# Patient Record
Sex: Female | Born: 1960 | Race: Black or African American | Hispanic: No | Smoking: Never smoker
Health system: Southern US, Community
[De-identification: ages and names within clinical notes are randomized; demographics above are authoritative.]

## PROBLEM LIST (undated history)

## (undated) DIAGNOSIS — C801 Malignant (primary) neoplasm, unspecified: Secondary | ICD-10-CM

## (undated) DIAGNOSIS — I10 Essential (primary) hypertension: Secondary | ICD-10-CM

## (undated) HISTORY — DX: Malignant (primary) neoplasm, unspecified: C80.1

## (undated) HISTORY — PX: COLON SURGERY: SHX602

---

## 2001-04-05 ENCOUNTER — Other Ambulatory Visit: Admission: RE | Admit: 2001-04-05 | Discharge: 2001-04-05 | Payer: Self-pay | Admitting: Family Medicine

## 2002-06-03 ENCOUNTER — Encounter: Admission: RE | Admit: 2002-06-03 | Discharge: 2002-06-03 | Payer: Self-pay | Admitting: Family Medicine

## 2002-06-03 ENCOUNTER — Encounter: Payer: Self-pay | Admitting: Family Medicine

## 2002-07-07 ENCOUNTER — Encounter: Admission: RE | Admit: 2002-07-07 | Discharge: 2002-07-07 | Payer: Self-pay

## 2003-05-23 ENCOUNTER — Other Ambulatory Visit: Admission: RE | Admit: 2003-05-23 | Discharge: 2003-05-23 | Payer: Self-pay | Admitting: Family Medicine

## 2003-06-02 ENCOUNTER — Encounter: Admission: RE | Admit: 2003-06-02 | Discharge: 2003-06-02 | Payer: Self-pay | Admitting: Family Medicine

## 2003-06-14 ENCOUNTER — Encounter: Admission: RE | Admit: 2003-06-14 | Discharge: 2003-06-14 | Payer: Self-pay | Admitting: Family Medicine

## 2003-11-28 ENCOUNTER — Encounter: Admission: RE | Admit: 2003-11-28 | Discharge: 2003-11-28 | Payer: Self-pay | Admitting: Family Medicine

## 2004-01-14 DIAGNOSIS — C189 Malignant neoplasm of colon, unspecified: Secondary | ICD-10-CM

## 2004-01-14 HISTORY — DX: Malignant neoplasm of colon, unspecified: C18.9

## 2004-09-24 ENCOUNTER — Other Ambulatory Visit: Admission: RE | Admit: 2004-09-24 | Discharge: 2004-09-24 | Payer: Self-pay | Admitting: Family Medicine

## 2004-12-20 ENCOUNTER — Ambulatory Visit (HOSPITAL_COMMUNITY): Admission: RE | Admit: 2004-12-20 | Discharge: 2004-12-20 | Payer: Self-pay | Admitting: Gastroenterology

## 2004-12-24 ENCOUNTER — Ambulatory Visit (HOSPITAL_COMMUNITY): Admission: RE | Admit: 2004-12-24 | Discharge: 2004-12-24 | Payer: Self-pay | Admitting: Gastroenterology

## 2005-01-09 ENCOUNTER — Inpatient Hospital Stay (HOSPITAL_COMMUNITY): Admission: RE | Admit: 2005-01-09 | Discharge: 2005-01-15 | Payer: Self-pay | Admitting: General Surgery

## 2005-01-09 ENCOUNTER — Encounter (INDEPENDENT_AMBULATORY_CARE_PROVIDER_SITE_OTHER): Payer: Self-pay | Admitting: Specialist

## 2005-01-28 ENCOUNTER — Ambulatory Visit: Payer: Self-pay | Admitting: Hematology and Oncology

## 2005-02-19 ENCOUNTER — Ambulatory Visit (HOSPITAL_COMMUNITY): Admission: RE | Admit: 2005-02-19 | Discharge: 2005-02-19 | Payer: Self-pay | Admitting: General Surgery

## 2005-03-20 ENCOUNTER — Ambulatory Visit: Payer: Self-pay | Admitting: Hematology and Oncology

## 2005-04-23 LAB — COMPREHENSIVE METABOLIC PANEL
AST: 35 U/L (ref 0–37)
Albumin: 4.6 g/dL (ref 3.5–5.2)
BUN: 10 mg/dL (ref 6–23)
Calcium: 9.3 mg/dL (ref 8.4–10.5)
Chloride: 102 mEq/L (ref 96–112)
Potassium: 3.7 mEq/L (ref 3.5–5.3)

## 2005-04-23 LAB — CBC WITH DIFFERENTIAL/PLATELET
BASO%: 0.4 % (ref 0.0–2.0)
Basophils Absolute: 0 10*3/uL (ref 0.0–0.1)
HCT: 35.1 % (ref 34.8–46.6)
HGB: 11.6 g/dL (ref 11.6–15.9)
MCHC: 32.9 g/dL (ref 32.0–36.0)
MONO#: 0.5 10*3/uL (ref 0.1–0.9)
NEUT#: 1.6 10*3/uL (ref 1.5–6.5)
NEUT%: 35.1 % — ABNORMAL LOW (ref 39.6–76.8)
WBC: 4.7 10*3/uL (ref 3.9–10.0)
lymph#: 2.4 10*3/uL (ref 0.9–3.3)

## 2005-05-06 ENCOUNTER — Ambulatory Visit: Payer: Self-pay | Admitting: Hematology and Oncology

## 2005-05-07 LAB — COMPREHENSIVE METABOLIC PANEL
AST: 53 U/L — ABNORMAL HIGH (ref 0–37)
Albumin: 4.2 g/dL (ref 3.5–5.2)
BUN: 10 mg/dL (ref 6–23)
CO2: 30 mEq/L (ref 19–32)
Calcium: 9.2 mg/dL (ref 8.4–10.5)
Chloride: 101 mEq/L (ref 96–112)
Creatinine, Ser: 1 mg/dL (ref 0.4–1.2)
Glucose, Bld: 91 mg/dL (ref 70–99)
Potassium: 3.7 mEq/L (ref 3.5–5.3)

## 2005-05-07 LAB — CBC WITH DIFFERENTIAL/PLATELET
Basophils Absolute: 0 10*3/uL (ref 0.0–0.1)
EOS%: 0.2 % (ref 0.0–7.0)
Eosinophils Absolute: 0 10*3/uL (ref 0.0–0.5)
HCT: 34.9 % (ref 34.8–46.6)
HGB: 11.7 g/dL (ref 11.6–15.9)
MCH: 29.4 pg (ref 26.0–34.0)
NEUT#: 3.9 10*3/uL (ref 1.5–6.5)
NEUT%: 56.3 % (ref 39.6–76.8)
lymph#: 2.4 10*3/uL (ref 0.9–3.3)

## 2005-05-07 LAB — MAGNESIUM: Magnesium: 1.8 mg/dL (ref 1.5–2.5)

## 2005-05-15 LAB — CBC WITH DIFFERENTIAL/PLATELET
Eosinophils Absolute: 0 10*3/uL (ref 0.0–0.5)
HCT: 36.9 % (ref 34.8–46.6)
HGB: 12.5 g/dL (ref 11.6–15.9)
LYMPH%: 49 % — ABNORMAL HIGH (ref 14.0–48.0)
MONO#: 0.6 10*3/uL (ref 0.1–0.9)
NEUT#: 1.6 10*3/uL (ref 1.5–6.5)
NEUT%: 35.9 % — ABNORMAL LOW (ref 39.6–76.8)
Platelets: 190 10*3/uL (ref 145–400)
WBC: 4.5 10*3/uL (ref 3.9–10.0)

## 2005-06-04 LAB — CBC WITH DIFFERENTIAL/PLATELET
Eosinophils Absolute: 0 10*3/uL (ref 0.0–0.5)
HCT: 36.6 % (ref 34.8–46.6)
LYMPH%: 43.4 % (ref 14.0–48.0)
MCHC: 33.3 g/dL (ref 32.0–36.0)
MCV: 94.3 fL (ref 81.0–101.0)
MONO#: 0.5 10*3/uL (ref 0.1–0.9)
NEUT#: 2.1 10*3/uL (ref 1.5–6.5)
NEUT%: 44.9 % (ref 39.6–76.8)
Platelets: 167 10*3/uL (ref 145–400)
WBC: 4.7 10*3/uL (ref 3.9–10.0)

## 2005-06-04 LAB — COMPREHENSIVE METABOLIC PANEL
CO2: 27 mEq/L (ref 19–32)
Creatinine, Ser: 0.8 mg/dL (ref 0.4–1.2)
Glucose, Bld: 92 mg/dL (ref 70–99)
Total Bilirubin: 0.7 mg/dL (ref 0.3–1.2)

## 2005-06-04 LAB — MAGNESIUM: Magnesium: 1.7 mg/dL (ref 1.5–2.5)

## 2005-06-23 ENCOUNTER — Ambulatory Visit: Payer: Self-pay | Admitting: Hematology and Oncology

## 2005-06-25 LAB — CBC WITH DIFFERENTIAL/PLATELET
Basophils Absolute: 0 10*3/uL (ref 0.0–0.1)
Eosinophils Absolute: 0 10*3/uL (ref 0.0–0.5)
LYMPH%: 51 % — ABNORMAL HIGH (ref 14.0–48.0)
MCV: 97.7 fL (ref 81.0–101.0)
MONO%: 10.4 % (ref 0.0–13.0)
NEUT#: 1.7 10*3/uL (ref 1.5–6.5)
Platelets: 169 10*3/uL (ref 145–400)
RBC: 3.83 10*6/uL (ref 3.70–5.32)

## 2005-06-25 LAB — COMPREHENSIVE METABOLIC PANEL
Alkaline Phosphatase: 67 U/L (ref 39–117)
BUN: 11 mg/dL (ref 6–23)
Creatinine, Ser: 0.79 mg/dL (ref 0.40–1.20)
Glucose, Bld: 92 mg/dL (ref 70–99)
Sodium: 135 mEq/L (ref 135–145)
Total Bilirubin: 0.7 mg/dL (ref 0.3–1.2)

## 2005-07-08 ENCOUNTER — Encounter: Admission: RE | Admit: 2005-07-08 | Discharge: 2005-07-08 | Payer: Self-pay | Admitting: Hematology and Oncology

## 2005-08-04 ENCOUNTER — Ambulatory Visit: Payer: Self-pay | Admitting: Hematology and Oncology

## 2005-08-04 LAB — CBC WITH DIFFERENTIAL/PLATELET
BASO%: 0.2 % (ref 0.0–2.0)
EOS%: 0.6 % (ref 0.0–7.0)
MCH: 33.3 pg (ref 26.0–34.0)
MCHC: 33.4 g/dL (ref 32.0–36.0)
MCV: 99.5 fL (ref 81.0–101.0)
MONO%: 9.3 % (ref 0.0–13.0)
RBC: 4.05 10*6/uL (ref 3.70–5.32)
RDW: 16.3 % — ABNORMAL HIGH (ref 11.3–14.5)

## 2005-08-04 LAB — COMPREHENSIVE METABOLIC PANEL
AST: 32 U/L (ref 0–37)
Albumin: 4.5 g/dL (ref 3.5–5.2)
Alkaline Phosphatase: 65 U/L (ref 39–117)
BUN: 12 mg/dL (ref 6–23)
Potassium: 3.8 mEq/L (ref 3.5–5.3)
Sodium: 142 mEq/L (ref 135–145)
Total Protein: 7.6 g/dL (ref 6.0–8.3)

## 2005-08-05 ENCOUNTER — Ambulatory Visit (HOSPITAL_COMMUNITY): Admission: RE | Admit: 2005-08-05 | Discharge: 2005-08-05 | Payer: Self-pay | Admitting: Hematology and Oncology

## 2005-09-24 ENCOUNTER — Ambulatory Visit: Payer: Self-pay | Admitting: Hematology and Oncology

## 2005-11-04 ENCOUNTER — Ambulatory Visit (HOSPITAL_COMMUNITY): Admission: RE | Admit: 2005-11-04 | Discharge: 2005-11-04 | Payer: Self-pay | Admitting: Hematology and Oncology

## 2005-11-07 ENCOUNTER — Ambulatory Visit: Payer: Self-pay | Admitting: Hematology and Oncology

## 2005-11-21 ENCOUNTER — Ambulatory Visit (HOSPITAL_BASED_OUTPATIENT_CLINIC_OR_DEPARTMENT_OTHER): Admission: RE | Admit: 2005-11-21 | Discharge: 2005-11-21 | Payer: Self-pay | Admitting: General Surgery

## 2006-01-27 ENCOUNTER — Ambulatory Visit: Payer: Self-pay | Admitting: Hematology and Oncology

## 2006-02-04 LAB — COMPREHENSIVE METABOLIC PANEL
AST: 18 U/L (ref 0–37)
Albumin: 4.5 g/dL (ref 3.5–5.2)
Alkaline Phosphatase: 53 U/L (ref 39–117)
BUN: 11 mg/dL (ref 6–23)
Calcium: 9.4 mg/dL (ref 8.4–10.5)
Chloride: 101 mEq/L (ref 96–112)
Glucose, Bld: 118 mg/dL — ABNORMAL HIGH (ref 70–99)
Potassium: 3.6 mEq/L (ref 3.5–5.3)
Sodium: 140 mEq/L (ref 135–145)
Total Protein: 7.2 g/dL (ref 6.0–8.3)

## 2006-02-04 LAB — CBC WITH DIFFERENTIAL/PLATELET
Basophils Absolute: 0 10*3/uL (ref 0.0–0.1)
Eosinophils Absolute: 0.1 10*3/uL (ref 0.0–0.5)
HGB: 12.4 g/dL (ref 11.6–15.9)
MONO#: 0.3 10*3/uL (ref 0.1–0.9)
MONO%: 4.8 % (ref 0.0–13.0)
NEUT#: 3.9 10*3/uL (ref 1.5–6.5)
RBC: 4.18 10*6/uL (ref 3.70–5.32)
RDW: 13 % (ref 11.3–14.5)
WBC: 6.8 10*3/uL (ref 3.9–10.0)
lymph#: 2.4 10*3/uL (ref 0.9–3.3)

## 2006-04-28 ENCOUNTER — Ambulatory Visit: Payer: Self-pay | Admitting: Hematology and Oncology

## 2006-05-01 LAB — CBC WITH DIFFERENTIAL/PLATELET
BASO%: 0.5 % (ref 0.0–2.0)
Basophils Absolute: 0 10*3/uL (ref 0.0–0.1)
EOS%: 2 % (ref 0.0–7.0)
Eosinophils Absolute: 0.1 10*3/uL (ref 0.0–0.5)
HGB: 12.2 g/dL (ref 11.6–15.9)
LYMPH%: 47.3 % (ref 14.0–48.0)
MCH: 29.4 pg (ref 26.0–34.0)
MCHC: 33.8 g/dL (ref 32.0–36.0)
MCV: 87.1 fL (ref 81.0–101.0)
MONO%: 4.7 % (ref 0.0–13.0)
NEUT#: 2.7 10*3/uL (ref 1.5–6.5)
NEUT%: 45.5 % (ref 39.6–76.8)
Platelets: 273 10*3/uL (ref 145–400)
RBC: 4.15 10*6/uL (ref 3.70–5.32)

## 2006-05-01 LAB — COMPREHENSIVE METABOLIC PANEL
AST: 22 U/L (ref 0–37)
Albumin: 4.3 g/dL (ref 3.5–5.2)
Alkaline Phosphatase: 46 U/L (ref 39–117)
BUN: 11 mg/dL (ref 6–23)
Calcium: 9.2 mg/dL (ref 8.4–10.5)
Chloride: 101 mEq/L (ref 96–112)
Creatinine, Ser: 0.79 mg/dL (ref 0.40–1.20)
Glucose, Bld: 102 mg/dL — ABNORMAL HIGH (ref 70–99)
Total Bilirubin: 0.8 mg/dL (ref 0.3–1.2)

## 2006-05-04 ENCOUNTER — Ambulatory Visit (HOSPITAL_COMMUNITY): Admission: RE | Admit: 2006-05-04 | Discharge: 2006-05-04 | Payer: Self-pay | Admitting: Hematology and Oncology

## 2006-09-09 ENCOUNTER — Ambulatory Visit: Payer: Self-pay | Admitting: Hematology and Oncology

## 2006-09-11 LAB — CBC WITH DIFFERENTIAL/PLATELET
Basophils Absolute: 0 10*3/uL (ref 0.0–0.1)
Eosinophils Absolute: 0.1 10*3/uL (ref 0.0–0.5)
HCT: 33.4 % — ABNORMAL LOW (ref 34.8–46.6)
HGB: 11.5 g/dL — ABNORMAL LOW (ref 11.6–15.9)
LYMPH%: 48.6 % — ABNORMAL HIGH (ref 14.0–48.0)
MONO#: 0.3 10*3/uL (ref 0.1–0.9)
NEUT#: 2.4 10*3/uL (ref 1.5–6.5)
Platelets: 293 10*3/uL (ref 145–400)
RBC: 3.88 10*6/uL (ref 3.70–5.32)
WBC: 5.4 10*3/uL (ref 3.9–10.0)

## 2006-09-11 LAB — COMPREHENSIVE METABOLIC PANEL
Albumin: 4.2 g/dL (ref 3.5–5.2)
BUN: 12 mg/dL (ref 6–23)
CO2: 26 mEq/L (ref 19–32)
Glucose, Bld: 85 mg/dL (ref 70–99)
Potassium: 3.8 mEq/L (ref 3.5–5.3)
Sodium: 137 mEq/L (ref 135–145)
Total Bilirubin: 0.4 mg/dL (ref 0.3–1.2)
Total Protein: 6.7 g/dL (ref 6.0–8.3)

## 2006-12-18 ENCOUNTER — Ambulatory Visit: Payer: Self-pay | Admitting: Hematology and Oncology

## 2006-12-22 LAB — CBC WITH DIFFERENTIAL/PLATELET
Basophils Absolute: 0 10*3/uL (ref 0.0–0.1)
Eosinophils Absolute: 0.1 10*3/uL (ref 0.0–0.5)
HCT: 34.7 % — ABNORMAL LOW (ref 34.8–46.6)
HGB: 11.8 g/dL (ref 11.6–15.9)
LYMPH%: 45.3 % (ref 14.0–48.0)
MCH: 29.6 pg (ref 26.0–34.0)
MCV: 87 fL (ref 81.0–101.0)
MONO%: 7.9 % (ref 0.0–13.0)
NEUT#: 2.8 10*3/uL (ref 1.5–6.5)
NEUT%: 45.2 % (ref 39.6–76.8)
Platelets: 331 10*3/uL (ref 145–400)
RDW: 13 % (ref 11.3–14.5)

## 2006-12-22 LAB — COMPREHENSIVE METABOLIC PANEL
Albumin: 4.3 g/dL (ref 3.5–5.2)
Alkaline Phosphatase: 46 U/L (ref 39–117)
BUN: 12 mg/dL (ref 6–23)
Creatinine, Ser: 1.05 mg/dL (ref 0.40–1.20)
Glucose, Bld: 92 mg/dL (ref 70–99)
Potassium: 3.8 mEq/L (ref 3.5–5.3)

## 2006-12-23 ENCOUNTER — Ambulatory Visit (HOSPITAL_COMMUNITY): Admission: RE | Admit: 2006-12-23 | Discharge: 2006-12-23 | Payer: Self-pay | Admitting: Hematology and Oncology

## 2007-03-31 ENCOUNTER — Emergency Department (HOSPITAL_COMMUNITY): Admission: EM | Admit: 2007-03-31 | Discharge: 2007-03-31 | Payer: Self-pay | Admitting: Emergency Medicine

## 2007-06-15 ENCOUNTER — Ambulatory Visit: Payer: Self-pay | Admitting: Hematology and Oncology

## 2007-06-17 LAB — CBC WITH DIFFERENTIAL/PLATELET
BASO%: 0.5 % (ref 0.0–2.0)
Basophils Absolute: 0 10*3/uL (ref 0.0–0.1)
EOS%: 1.1 % (ref 0.0–7.0)
HGB: 13.1 g/dL (ref 11.6–15.9)
MCH: 29 pg (ref 26.0–34.0)
MCHC: 33.5 g/dL (ref 32.0–36.0)
MONO#: 0.4 10*3/uL (ref 0.1–0.9)
RDW: 13.6 % (ref 11.3–14.5)
WBC: 5.9 10*3/uL (ref 3.9–10.0)
lymph#: 2.1 10*3/uL (ref 0.9–3.3)

## 2007-06-17 LAB — COMPREHENSIVE METABOLIC PANEL
ALT: 24 U/L (ref 0–35)
AST: 28 U/L (ref 0–37)
Albumin: 4.6 g/dL (ref 3.5–5.2)
Calcium: 9.9 mg/dL (ref 8.4–10.5)
Chloride: 104 mEq/L (ref 96–112)
Potassium: 4.1 mEq/L (ref 3.5–5.3)

## 2007-06-24 ENCOUNTER — Ambulatory Visit (HOSPITAL_COMMUNITY): Admission: RE | Admit: 2007-06-24 | Discharge: 2007-06-24 | Payer: Self-pay | Admitting: Hematology and Oncology

## 2007-12-22 ENCOUNTER — Ambulatory Visit: Payer: Self-pay | Admitting: Hematology and Oncology

## 2008-01-19 LAB — CBC WITH DIFFERENTIAL/PLATELET
BASO%: 0.5 % (ref 0.0–2.0)
Basophils Absolute: 0 10*3/uL (ref 0.0–0.1)
Eosinophils Absolute: 0.1 10*3/uL (ref 0.0–0.5)
HCT: 38.5 % (ref 34.8–46.6)
HGB: 12.8 g/dL (ref 11.6–15.9)
LYMPH%: 32.4 % (ref 14.0–48.0)
MONO#: 0.4 10*3/uL (ref 0.1–0.9)
NEUT#: 3.6 10*3/uL (ref 1.5–6.5)
NEUT%: 59.1 % (ref 39.6–76.8)
Platelets: 271 10*3/uL (ref 145–400)
WBC: 6 10*3/uL (ref 3.9–10.0)
lymph#: 1.9 10*3/uL (ref 0.9–3.3)

## 2008-01-19 LAB — CEA: CEA: <0.5 ng/mL (ref 0.0–5.0)

## 2008-01-19 LAB — COMPREHENSIVE METABOLIC PANEL
ALT: 17 U/L (ref 0–35)
BUN: 11 mg/dL (ref 6–23)
CO2: 23 mEq/L (ref 19–32)
Calcium: 9.2 mg/dL (ref 8.4–10.5)
Chloride: 104 mEq/L (ref 96–112)
Creatinine, Ser: 0.94 mg/dL (ref 0.40–1.20)
Glucose, Bld: 109 mg/dL — ABNORMAL HIGH (ref 70–99)

## 2008-01-25 ENCOUNTER — Ambulatory Visit (HOSPITAL_COMMUNITY): Admission: RE | Admit: 2008-01-25 | Discharge: 2008-01-25 | Payer: Self-pay | Admitting: Hematology and Oncology

## 2008-07-11 ENCOUNTER — Emergency Department (HOSPITAL_COMMUNITY): Admission: EM | Admit: 2008-07-11 | Discharge: 2008-07-11 | Payer: Self-pay | Admitting: Emergency Medicine

## 2008-07-18 ENCOUNTER — Ambulatory Visit: Payer: Self-pay | Admitting: Hematology and Oncology

## 2008-12-25 ENCOUNTER — Encounter: Admission: RE | Admit: 2008-12-25 | Discharge: 2008-12-25 | Payer: Self-pay | Admitting: Family Medicine

## 2009-03-15 IMAGING — CT CT PELVIS W/ CM
1 of 4 series · 13 of 32 positions shown, 18 images · IV contrast (omnipaque)
Comparison: 05/04/06

CLINICAL DATA: Colon cancer.
CHEST CT WITH CONTRAST:
TECHNIQUE: Multidetector CT imaging of the chest was performed following the standard protocol during bolus administration of intravenous contrast.
Contrast:  100 cc Omnipaque 300
TECHNIQUE: Multidetector CT imaging of the abdomen was performed following the standard protocol during bolus administration of intravenous contrast.
TECHNIQUE: Multidetector CT imaging of the pelvis was performed following the standard protocol during bolus administration of intravenous contrast.

[Series 2: cap 5.0 b40f st · axial · 0.80mm/px · z∈[+1173,+1683]mm · 13 of 118 slices shown, 18 images]
[im 8/118  soft-tissue]
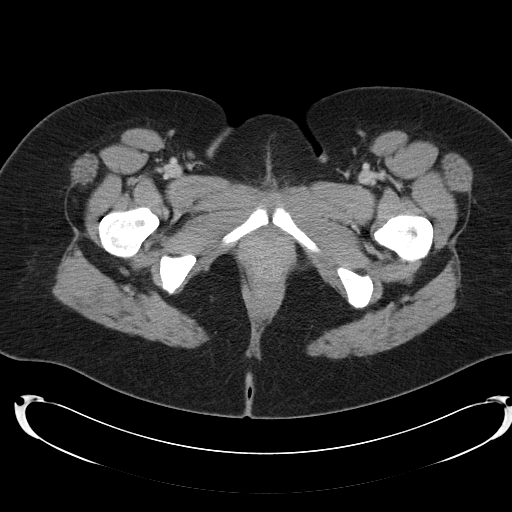
[im 8/118  bone]
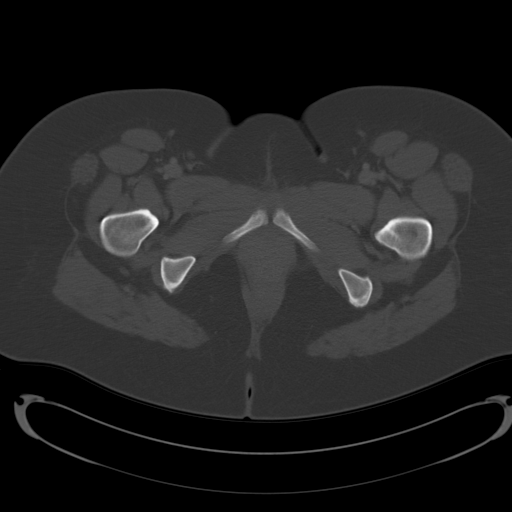
[im 15/118  soft-tissue]
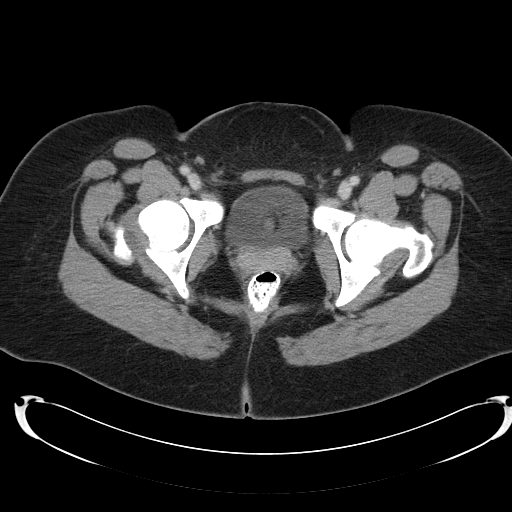
[im 30/118  soft-tissue]
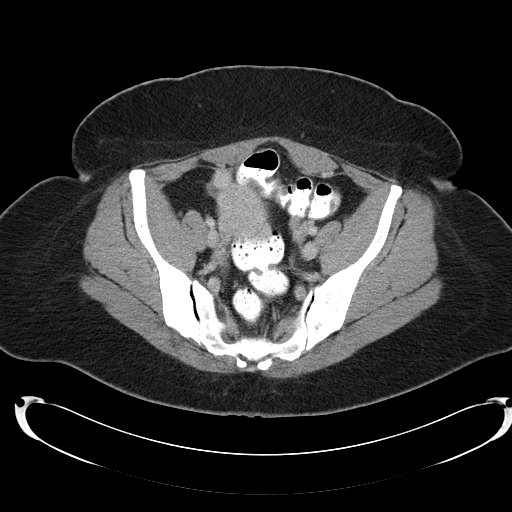
[im 37/118  soft-tissue]
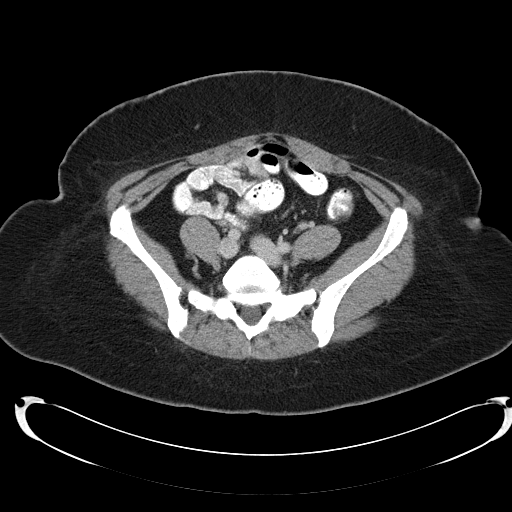
[im 44/118  soft-tissue]
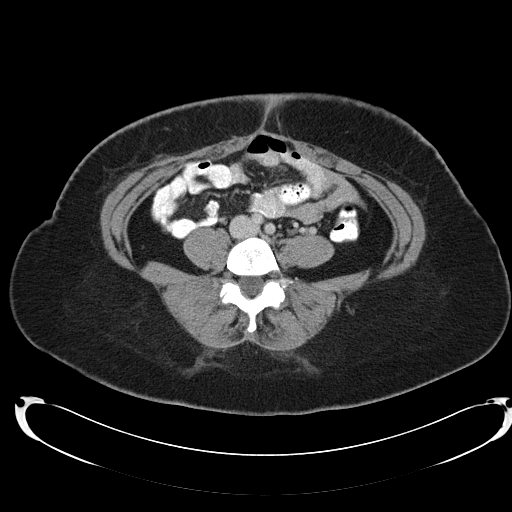
[im 52/118  soft-tissue]
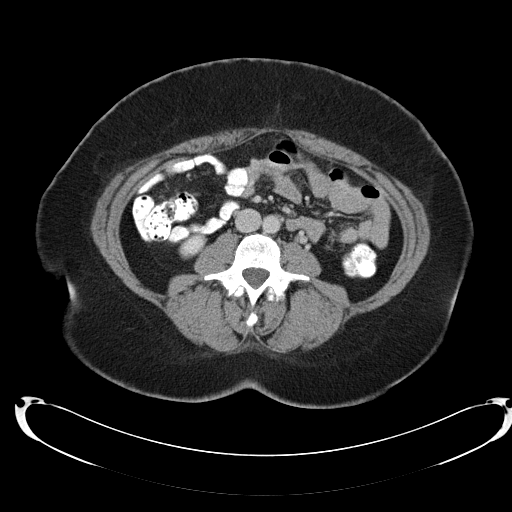
[im 66/118  soft-tissue]
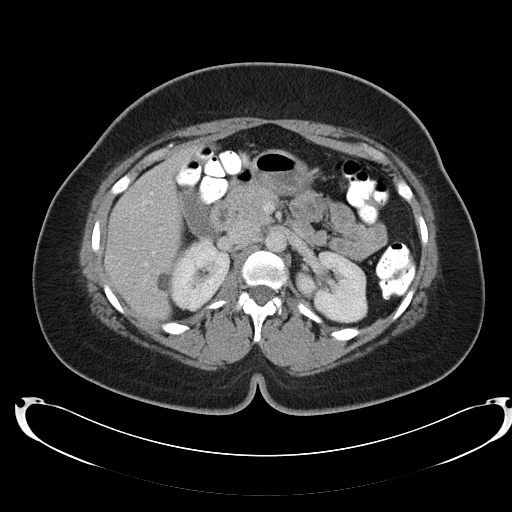
[im 74/118  soft-tissue]
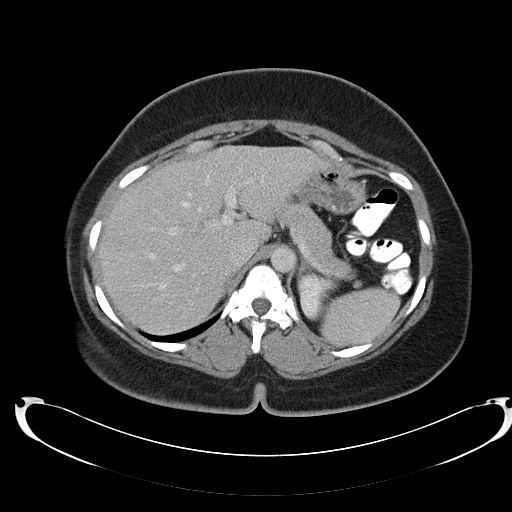
[im 81/118  soft-tissue]
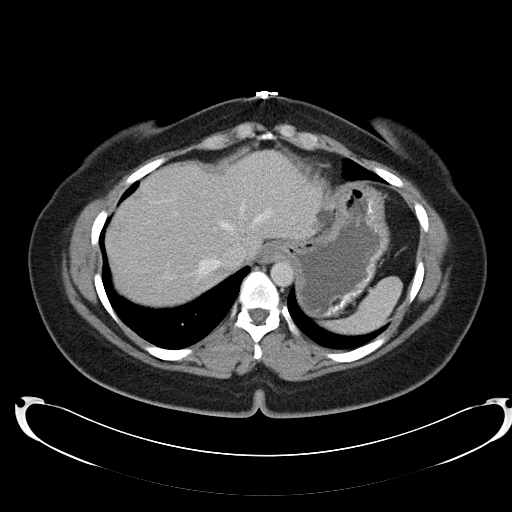
[im 81/118  bone]
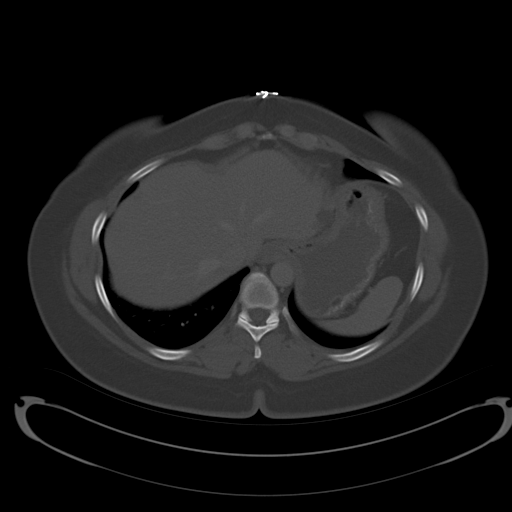
[im 88/118  soft-tissue]
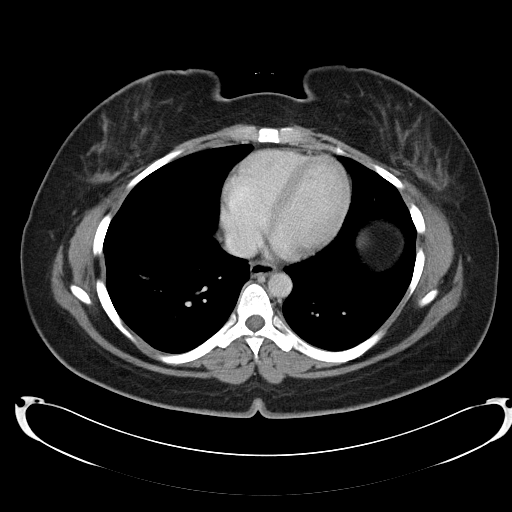
[im 88/118  lung]
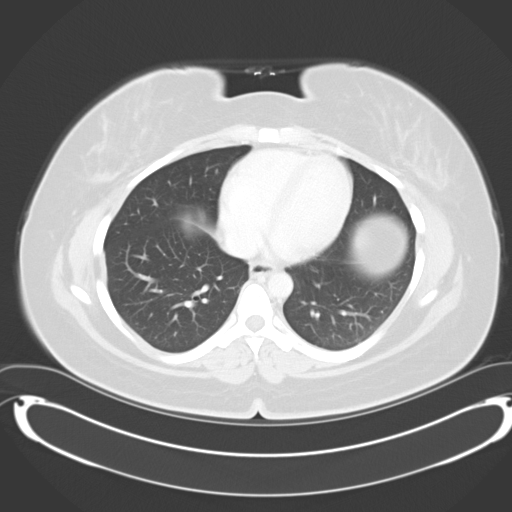
[im 96/118  lung]
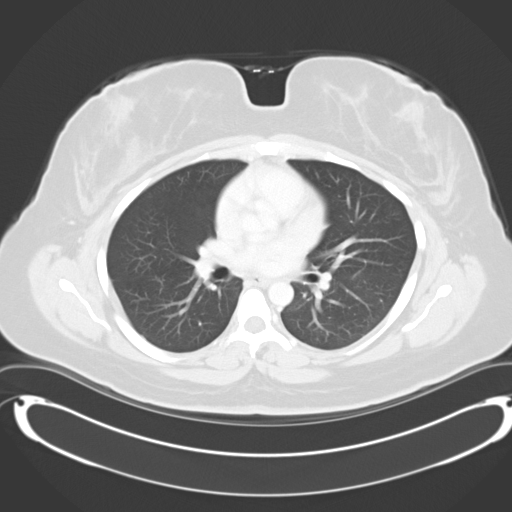
[im 103/118  soft-tissue]
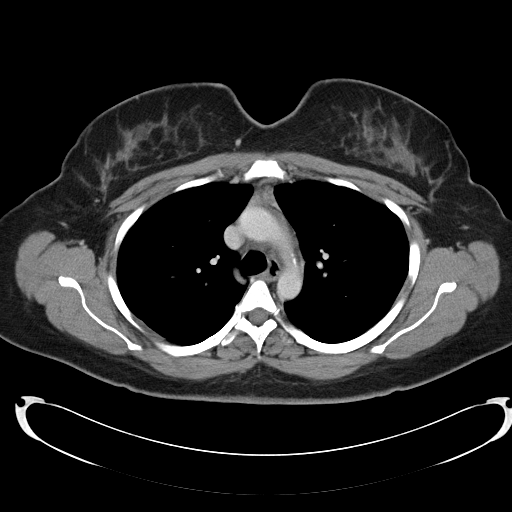
[im 103/118  lung]
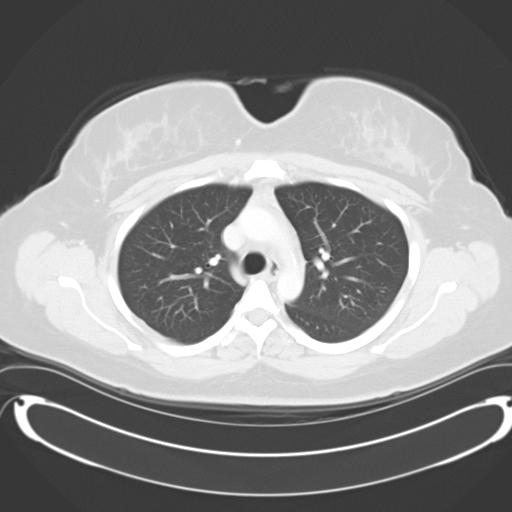
[im 110/118  soft-tissue]
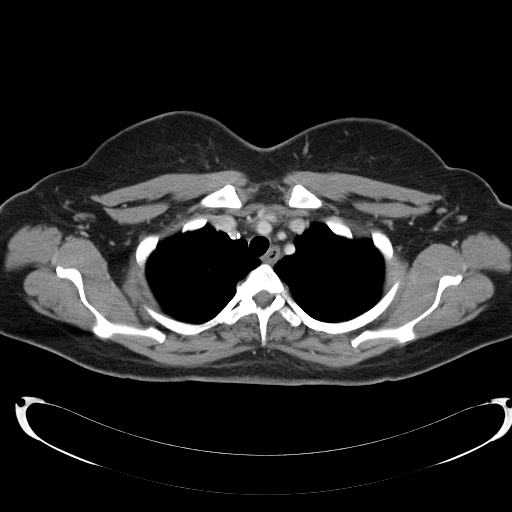
[im 110/118  lung]
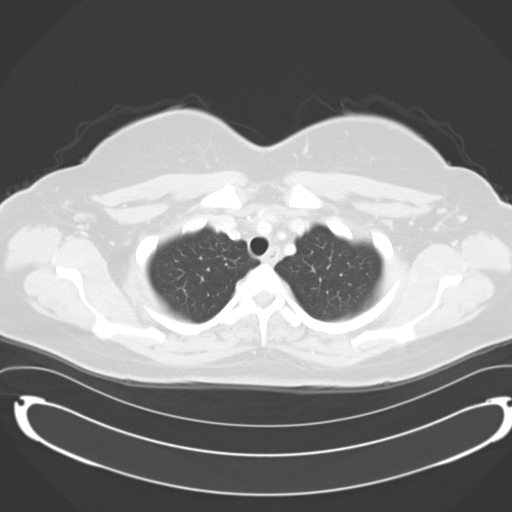

[13 of 32 positions shown; findings below may reference images not displayed]

FINDINGS: Tiny low-density lesion in the right lobe of the thyroid is unchanged.  Triangular-shaped soft tissue in the anterior mediastinum is stable and consistent with thymic tissue, possibly due to hyperplasia/rebound.  Heart size normal.  No pericardial effusion.  No pathologically-enlarged mediastinal, hilar, or axillary lymph nodes.  
A small subpleural nodule along the left major fissure is unchanged from 08/05/05 and most consistent with a subpleural lymph node.  No new pulmonary nodules.  Lungs are otherwise clear.  No pleural fluid.  Airway unremarkable.
IMPRESSION: No evidence of metastatic disease.
ABDOMEN CT WITH CONTRAST:
FINDINGS: A 2.4 x 1.8cm low-density lesion in the left hepatic lobe is stable and consistent with a cyst.  A smaller cyst is seen in the right hepatic lobe.  Liver, gallbladder, adrenal glands otherwise unremarkable.  Tiny low-density lesion in the right kidney is stable and likely a tiny cyst.  Kidneys, spleen, pancreas, stomach and bowel otherwise unremarkable.  A small portocaval lymph node measures 6mm (image # 47), stable.  No pathologically-enlarged lymph nodes.  No free fluid.
IMPRESSION: No evidence of metastatic disease. 
PELVIS CT WITH CONTRAST:
FINDINGS: Uterus is enlarged and contains several fibroids.  Low-density lesions are seen in both ovaries and likely represent follicles.  Colon unremarkable.  No pathologically-enlarged lymph nodes.  No free fluid.  Periumbilical hernia contains fat, stable.
IMPRESSION: 1.  No evidence of recurrent or metastatic disease.
2.  Fibroid uterus. 
3.  Stable periumbilical hernia.

## 2009-06-06 ENCOUNTER — Encounter: Admission: RE | Admit: 2009-06-06 | Discharge: 2009-06-06 | Payer: Self-pay | Admitting: Family Medicine

## 2009-12-18 ENCOUNTER — Encounter (INDEPENDENT_AMBULATORY_CARE_PROVIDER_SITE_OTHER): Payer: Self-pay | Admitting: Family Medicine

## 2009-12-18 ENCOUNTER — Ambulatory Visit (HOSPITAL_COMMUNITY)
Admission: RE | Admit: 2009-12-18 | Discharge: 2009-12-18 | Payer: Self-pay | Source: Home / Self Care | Admitting: Family Medicine

## 2010-02-02 ENCOUNTER — Encounter: Payer: Self-pay | Admitting: Gastroenterology

## 2010-02-03 ENCOUNTER — Encounter: Payer: Self-pay | Admitting: Hematology and Oncology

## 2010-02-04 ENCOUNTER — Encounter: Payer: Self-pay | Admitting: Family Medicine

## 2010-05-31 NOTE — Discharge Summary (Signed)
Monique Robinson, Monique Robinson NO.:  [id number]   MEDICAL RECORD NO.:  [id number]          PATIENT TYPE:  INP   LOCATION:  1607                         FACILITY:  [facility]   PHYSICIAN:  Leonie Man, M.D.   DATE OF BIRTH:  1960-12-25   DATE OF ADMISSION:  01/09/2005  DATE OF DISCHARGE:  01/15/2005                                 DISCHARGE SUMMARY   ADMISSION DIAGNOSIS:  Carcinoma of the colon.   DISCHARGE DIAGNOSIS:  Carcinoma of the colon.   PATHOLOGY:  T3 N0 M0, grade 2 tumor of the colon.   PROCEDURE:  Right hemicolectomy and ileocolic anastomosis.   COMPLICATIONS:  None.   CONDITION ON DISCHARGE:  Improved.   Follow-up is scheduled for this patient in one week.  Planned follow-up with  oncology is also planned.   DISCHARGE MEDICATIONS:  1.  Percocet one tablet every four to six hours p.r.n. for pain.  2.  Naproxen 200 mg two tablets every twice daily for five days.  3.  Ferrous gluconate 300 mg daily.   ACTIVITIES:  As tolerated.   DIET:  Not restricted.   HOSPITAL COURSE:  The patient is a 50 year old female admitted for a lesion  of the cecum and ascending colon after she presented with a guaiac-positive  stool and colonoscopy showing a lesion in the descending colon.  Following admission the patient underwent the above-noted surgical  procedure.  Her postoperative course has been benign with the normal  resumption of bowel activity and diet.  She is being discharged now to be  followed up in the office in approximately one week.  I have planned  oncologic follow up with Dr. Dalene Carrow.      Leonie Man, M.D.  Electronically Signed     PB/MEDQ  D:  01/15/2005  T:  01/15/2005  Job:  [id number]   cc:   Jordan Hawks. Elnoria Howard, MD  Fax: [phone]   Lauretta I. Odogwu, M.D.  Fax: [phone]

## 2010-05-31 NOTE — Op Note (Signed)
NAMEDONNESHA, KARG NO.:  [id number]   MEDICAL RECORD NO.:  [id number]          PATIENT TYPE:  AMB   LOCATION:  DSC                          FACILITY:  [facility]   PHYSICIAN:  Leonie Man, M.D.   DATE OF BIRTH:  1960/04/18   DATE OF PROCEDURE:  11/21/2005  DATE OF DISCHARGE:  11/21/2005                               OPERATIVE REPORT   PREOPERATIVE DIAGNOSIS:  Port-A-Cath for removal.   POSTOPERATIVE DIAGNOSIS:  Port-A-Cath for removal.   PROCEDURE:  Removal of Port-A-Cath under local anesthesia 1% lidocaine  with epinephrine.   Ms. Monique Robinson is a 50 year old female who is status post right  hemicolectomy for carcinoma.  She has now completed course of  chemotherapy and wishes to have her Port-A-Cath removed.  She comes to  the operating room for same after risks and potential benefits of  surgery been discussed.   PROCEDURE:  The patient is positioned supinely and the Port-A-Cath which  was located on the right anterior chest.  This region is prepped with  Betadine and draped to be included in a sterile operative field.  This  region has a fairly thick keloid scar over it and it was excised in an  elliptical incision and dissection was carried down through the  subcutaneous tissues to the pocket of the Port-A-Cath.  This was opened  and the reservoir is located and delivered into the wound.  Sutures  holding the Port-A-Cath in place were then cut and the entire Port-A-  Cath then delivered up onto the skin.  The tunnel was then closed with a  3-0 Vicryl suture after the Silastic catheter is removed from the  subclavian region.  The subcutaneous tissues are then closed with  interrupted 3-0 Vicryl sutures.  The skin closed 4-0 Monocryl suture and  reinforced with Steri-Strips.  Sterile dressings applied.  The patient  removed from the operating room to the recovery room in stable  condition.  She tolerated the procedure well.      Leonie Man,  M.D.  Electronically Signed     PB/MEDQ  D:  11/24/2005  T:  11/24/2005  Job:  [id number]

## 2010-07-26 ENCOUNTER — Other Ambulatory Visit: Payer: Self-pay | Admitting: Family Medicine

## 2010-07-26 DIAGNOSIS — Z1231 Encounter for screening mammogram for malignant neoplasm of breast: Secondary | ICD-10-CM

## 2010-08-02 ENCOUNTER — Ambulatory Visit
Admission: RE | Admit: 2010-08-02 | Discharge: 2010-08-02 | Disposition: A | Discharge status: Home / Self Care | Payer: BC Managed Care – PPO | Source: Ambulatory Visit | Attending: Family Medicine | Admitting: Family Medicine

## 2010-08-02 DIAGNOSIS — Z1231 Encounter for screening mammogram for malignant neoplasm of breast: Secondary | ICD-10-CM

## 2010-10-07 LAB — POCT CARDIAC MARKERS
Myoglobin, poc: 72.9
Operator id: 257131

## 2010-10-07 LAB — I-STAT 8, (EC8 V) (CONVERTED LAB)
BUN: 16
Chloride: 103
HCT: 41
Hemoglobin: 13.9
Operator id: 257131
Potassium: 3.3 — ABNORMAL LOW
Sodium: 136

## 2010-10-07 LAB — POCT I-STAT CREATININE
Creatinine, Ser: 1.2
Operator id: 257131

## 2011-08-21 ENCOUNTER — Other Ambulatory Visit: Payer: Self-pay | Admitting: Family Medicine

## 2011-08-21 DIAGNOSIS — Z1231 Encounter for screening mammogram for malignant neoplasm of breast: Secondary | ICD-10-CM

## 2011-09-05 ENCOUNTER — Ambulatory Visit
Admission: RE | Admit: 2011-09-05 | Discharge: 2011-09-05 | Disposition: A | Discharge status: Home / Self Care | Payer: BC Managed Care – PPO | Source: Ambulatory Visit | Attending: Family Medicine | Admitting: Family Medicine

## 2011-09-05 DIAGNOSIS — Z1231 Encounter for screening mammogram for malignant neoplasm of breast: Secondary | ICD-10-CM

## 2011-09-09 ENCOUNTER — Other Ambulatory Visit: Payer: Self-pay | Admitting: Family Medicine

## 2011-09-09 DIAGNOSIS — R928 Other abnormal and inconclusive findings on diagnostic imaging of breast: Secondary | ICD-10-CM

## 2011-09-11 ENCOUNTER — Ambulatory Visit
Admission: RE | Admit: 2011-09-11 | Discharge: 2011-09-11 | Disposition: A | Discharge status: Home / Self Care | Payer: BC Managed Care – PPO | Source: Ambulatory Visit | Attending: Family Medicine | Admitting: Family Medicine

## 2011-09-11 DIAGNOSIS — R928 Other abnormal and inconclusive findings on diagnostic imaging of breast: Secondary | ICD-10-CM

## 2011-09-24 DIAGNOSIS — I10 Essential (primary) hypertension: Secondary | ICD-10-CM | POA: Insufficient documentation

## 2013-08-24 ENCOUNTER — Other Ambulatory Visit: Payer: Self-pay | Admitting: Family Medicine

## 2013-08-24 DIAGNOSIS — N6320 Unspecified lump in the left breast, unspecified quadrant: Secondary | ICD-10-CM

## 2013-08-31 ENCOUNTER — Other Ambulatory Visit: Payer: Self-pay | Admitting: Family Medicine

## 2013-08-31 DIAGNOSIS — N6320 Unspecified lump in the left breast, unspecified quadrant: Secondary | ICD-10-CM

## 2013-09-21 ENCOUNTER — Ambulatory Visit
Admission: RE | Admit: 2013-09-21 | Discharge: 2013-09-21 | Disposition: A | Discharge status: Home / Self Care | Payer: BC Managed Care – PPO | Source: Ambulatory Visit | Attending: Family Medicine | Admitting: Family Medicine

## 2013-09-21 ENCOUNTER — Encounter (INDEPENDENT_AMBULATORY_CARE_PROVIDER_SITE_OTHER): Payer: Self-pay

## 2013-09-21 DIAGNOSIS — N6320 Unspecified lump in the left breast, unspecified quadrant: Secondary | ICD-10-CM

## 2014-11-20 ENCOUNTER — Ambulatory Visit (INDEPENDENT_AMBULATORY_CARE_PROVIDER_SITE_OTHER): Payer: BLUE CROSS/BLUE SHIELD

## 2014-11-20 ENCOUNTER — Ambulatory Visit (INDEPENDENT_AMBULATORY_CARE_PROVIDER_SITE_OTHER): Payer: BLUE CROSS/BLUE SHIELD | Admitting: Internal Medicine

## 2014-11-20 VITALS — BP 122/82 | HR 84 | Temp 97.9°F | Resp 16 | Wt 182.0 lb

## 2014-11-20 DIAGNOSIS — Z23 Encounter for immunization: Secondary | ICD-10-CM | POA: Diagnosis not present

## 2014-11-20 DIAGNOSIS — M79644 Pain in right finger(s): Secondary | ICD-10-CM | POA: Diagnosis not present

## 2014-11-20 DIAGNOSIS — S61402A Unspecified open wound of left hand, initial encounter: Secondary | ICD-10-CM | POA: Diagnosis not present

## 2014-11-20 NOTE — Progress Notes (Signed)
Patient ID: Monique Robinson, female   DOB: 1960/11/22, 54 y.o.   MRN: [id number]   11/20/2014 at 10:43 AM  Monique Robinson / DOB: 10-Sep-1960 / MRN: [id number]  Problem list reviewed and updated by me where necessary.   SUBJECTIVE  Monique Robinson is a 54 y.o. well appearing female presenting for the chief complaint of trip and fall while working in yard,  grabeed fence with left hand causing palmar abrasins, caught right 5th finger in fence causing hyper extension and twisting and Now has pain distal 5th metacarpal and 5th finger. Is swollen but minimal deformity. Does have normal rom and function with pain..     She  has a past medical history of Cancer ([place]).    Medications reviewed and updated by myself where necessary, and exist elsewhere in the encounter.   Monique Robinson has No Known Allergies. She  reports that she has never smoked. She does not have any smokeless tobacco history on file. She reports that she does not drink alcohol or use illicit drugs. She  has no sexual activity history on file. The patient  has past surgical history that includes Colon surgery.  Her family history is not on file.  Review of Systems  Constitutional: Negative for fever.  Respiratory: Negative for shortness of breath.   Cardiovascular: Negative for chest pain.  Gastrointestinal: Negative for nausea.  Skin: Negative for rash.  Neurological: Negative for dizziness and headaches.    OBJECTIVE  Her  weight is 182 lb (82.555 kg). Her oral temperature is 97.9 F (36.6 C). Her blood pressure is 122/82 and her pulse is 84. Her respiration is 16 and oxygen saturation is 99%.  The patient's body mass index is unknown because there is no height on file.  Physical Exam  Constitutional: She is oriented to person, place, and time. She appears well-developed and well-nourished.  HENT:  Head: Normocephalic.  Eyes: Conjunctivae are normal. No scleral icterus.  Neck: Normal range of motion.  Cardiovascular:  Normal rate.   Respiratory: Effort normal.  GI: She exhibits no distension.  Musculoskeletal: She exhibits edema and tenderness.       Right hand: She exhibits decreased range of motion, tenderness, bony tenderness and swelling. She exhibits normal two-point discrimination, normal capillary refill, no deformity and no laceration.       Left hand: She exhibits bony tenderness and laceration. She exhibits normal range of motion, no tenderness, normal two-point discrimination, normal capillary refill, no deformity and no swelling.       Hands: Swollen, painfull, eccymosis. NMVS intact   Abrasions left palm  Neurological: She is alert and oriented to person, place, and time. She exhibits normal muscle tone. Coordination normal.  Skin: Skin is warm and dry.  Psychiatric: She has a normal mood and affect.   [facility] reading (PRIMARY) by  Dr.Guest ap and oblique appear normal, lateral has abnormal density possible avulsion? Stat read is normal.  No results found for this or any previous visit (from the past 24 hour(s)).  ASSESSMENT & PLAN RICE/Slint/Buddy tape Monique Robinson was seen today for finger injury and hand injury.  Diagnoses and all orders for this visit:  Pain of finger of right hand -     DG Finger Little Right; Future -     Tdap vaccine greater than or equal to 7yo IM  Open wound of left hand, initial encounter -     DG Finger Little Right; Future -     Tdap vaccine greater  than or equal to 7yo IM  Trip and fall while working in yard. Has abrasion

## 2014-11-20 NOTE — Patient Instructions (Addendum)
Tdap Vaccine (Tetanus, Diphtheria and Pertussis): What You Need to Know 1. Why get vaccinated? Tetanus, diphtheria and pertussis are very serious diseases. Tdap vaccine can protect us from these diseases. And, Tdap vaccine given to pregnant women can protect newborn babies against pertussis. TETANUS (Lockjaw) is rare in the [place] today. It causes painful muscle tightening and stiffness, usually all over the body.  It can lead to tightening of muscles in the head and neck so you can't open your mouth, swallow, or sometimes even breathe. Tetanus kills about 1 out of 10 people who are infected even after receiving the best medical care. DIPHTHERIA is also rare in the [place] today. It can cause a thick coating to form in the back of the throat.  It can lead to breathing problems, heart failure, paralysis, and death. PERTUSSIS (Whooping Cough) causes severe coughing spells, which can cause difficulty breathing, vomiting and disturbed sleep.  It can also lead to weight loss, incontinence, and rib fractures. Up to 2 in 100 adolescents and 5 in 100 adults with pertussis are hospitalized or have complications, which could include pneumonia or death. These diseases are caused by bacteria. Diphtheria and pertussis are spread from person to person through secretions from coughing or sneezing. Tetanus enters the body through cuts, scratches, or wounds. Before vaccines, as many as 200,000 cases of diphtheria, 200,000 cases of pertussis, and hundreds of cases of tetanus, were reported in the [place] each year. Since vaccination began, reports of cases for tetanus and diphtheria have dropped by about 99% and for pertussis by about 80%. 2. Tdap vaccine Tdap vaccine can protect adolescents and adults from tetanus, diphtheria, and pertussis. One dose of Tdap is routinely given at age 11 or 12. People who did not get Tdap at that age should get it as soon as possible. Tdap is especially important  for healthcare professionals and anyone having close contact with a baby younger than 12 months. Pregnant women should get a dose of Tdap during every pregnancy, to protect the newborn from pertussis. Infants are most at risk for severe, life-threatening complications from pertussis. Another vaccine, called Td, protects against tetanus and diphtheria, but not pertussis. A Td booster should be given every 10 years. Tdap may be given as one of these boosters if you have never gotten Tdap before. Tdap may also be given after a severe cut or burn to prevent tetanus infection. Your doctor or the person giving you the vaccine can give you more information. Tdap may safely be given at the same time as other vaccines. 3. Some people should not get this vaccine  A person who has ever had a life-threatening allergic reaction after a previous dose of any diphtheria, tetanus or pertussis containing vaccine, OR has a severe allergy to any part of this vaccine, should not get Tdap vaccine. Tell the person giving the vaccine about any severe allergies.  Anyone who had coma or long repeated seizures within 7 days after a childhood dose of DTP or DTaP, or a previous dose of Tdap, should not get Tdap, unless a cause other than the vaccine was found. They can still get Td.  Talk to your doctor if you:  have seizures or another nervous system problem,  had severe pain or swelling after any vaccine containing diphtheria, tetanus or pertussis,  ever had a condition called Guillain-Barr Syndrome (GBS),  aren't feeling well on the day the shot is scheduled. 4. Risks With any medicine, including vaccines, there is   a chance of side effects. These are usually mild and go away on their own. Serious reactions are also possible but are rare. Most people who get Tdap vaccine do not have any problems with it. Mild problems following Tdap (Did not interfere with activities)  Pain where the shot was given (about 3 in 4  adolescents or 2 in 3 adults)  Redness or swelling where the shot was given (about 1 person in 5)  Mild fever of at least 100.4F (up to about 1 in 25 adolescents or 1 in 100 adults)  Headache (about 3 or 4 people in 10)  Tiredness (about 1 person in 3 or 4)  Nausea, vomiting, diarrhea, stomach ache (up to 1 in 4 adolescents or 1 in 10 adults)  Chills, sore joints (about 1 person in 10)  Body aches (about 1 person in 3 or 4)  Rash, swollen glands (uncommon) Moderate problems following Tdap (Interfered with activities, but did not require medical attention)  Pain where the shot was given (up to 1 in 5 or 6)  Redness or swelling where the shot was given (up to about 1 in 16 adolescents or 1 in 12 adults)  Fever over 102F (about 1 in 100 adolescents or 1 in 250 adults)  Headache (about 1 in 7 adolescents or 1 in 10 adults)  Nausea, vomiting, diarrhea, stomach ache (up to 1 or 3 people in 100)  Swelling of the entire arm where the shot was given (up to about 1 in 500). Severe problems following Tdap (Unable to perform usual activities; required medical attention)  Swelling, severe pain, bleeding and redness in the arm where the shot was given (rare). Problems that could happen after any vaccine:  People sometimes faint after a medical procedure, including vaccination. Sitting or lying down for about 15 minutes can help prevent fainting, and injuries caused by a fall. Tell your doctor if you feel dizzy, or have vision changes or ringing in the ears.  Some people get severe pain in the shoulder and have difficulty moving the arm where a shot was given. This happens very rarely.  Any medication can cause a severe allergic reaction. Such reactions from a vaccine are very rare, estimated at fewer than 1 in a million doses, and would happen within a few minutes to a few hours after the vaccination. As with any medicine, there is a very remote chance of a vaccine causing a serious  injury or death. The safety of vaccines is always being monitored. For more information, visit: www.cdc.gov/vaccinesafety/ 5. What if there is a serious problem? What should I look for?  Look for anything that concerns you, such as signs of a severe allergic reaction, very high fever, or unusual behavior.  Signs of a severe allergic reaction can include hives, swelling of the face and throat, difficulty breathing, a fast heartbeat, dizziness, and weakness. These would usually start a few minutes to a few hours after the vaccination. What should I do?  If you think it is a severe allergic reaction or other emergency that can't wait, call 9-1-1 or get the person to the nearest hospital. Otherwise, call your doctor.  Afterward, the reaction should be reported to the Vaccine Adverse Event Reporting System (VAERS). Your doctor might file this report, or you can do it yourself through the VAERS web site at www.vaers.hhs.gov, or by calling 1-800-822-7967. VAERS does not give medical advice.  6. The National Vaccine Injury Compensation Program The National Vaccine Injury Compensation Program (  VICP) is a federal program that was created to compensate people who may have been injured by certain vaccines. Persons who believe they may have been injured by a vaccine can learn about the program and about filing a claim by calling 508 041 0204 or visiting the [place] website at GoldCloset.com.ee. There is a time limit to file a claim for compensation. 7. How can I learn more?  Ask your doctor. He or she can give you the vaccine package insert or suggest other sources of information.  Call your local or state health department.  Contact the Centers for Disease Control and Prevention (CDC):  Call 913-209-0400 (1-800-CDC-INFO) or  Visit CDC's website at http://hunter.com/ CDC Tdap Vaccine VIS (03/08/13)   This information is not intended to replace advice given to you by your health care  provider. Make sure you discuss any questions you have with your health care provider.   Document Released: 07/01/2011 Document Revised: 01/20/2014 Document Reviewed: 04/13/2013 Elsevier Interactive Patient Education 2016 [employer] Intermetacarpal Sprain The intermetacarpal ligaments run between the knuckles at the base of the fingers. These ligaments are vulnerable to sprain and injury in which the ligament becomes overstretched or torn. Intermetacarpal sprains are classified into 3 categories. Grade 1 sprains cause pain, but the tendon is not lengthened. Grade 2 sprains include a lengthened ligament, due to the ligament being stretched or partially ruptured. With grade 2 sprains there is still function, although function may be decreased. Grade 3 sprains include a complete tear of the ligament, and the joint usually displays a loss of function.  SYMPTOMS   Severe pain at the time of injury.  Often, a feeling of popping or tearing inside the hand.  Tenderness and inflammation at the knuckles.  Bruising within a couple days of injury.  Impaired ability to use the hand. CAUSES  This condition occurs when the intermetacarpal ligaments are subjected to a greater stress than they can handle. This causes the ligaments to become stretched or torn. RISK INCREASES WITH:  Previous hand injury.  Fighting sports (boxing, wrestling, martial arts).  Sports in which you could fall on an outstretched hand (soccer, basketball, volleyball).  Other sports with repeated hand trauma (water polo, gymnastics).  Poor hand strength and flexibility.  Inadequate or poorly fitted protective equipment. PREVENTION   Warm up and stretch properly before activity.  Maintain appropriate conditioning:  Hand flexibility.  Muscle strength and endurance.  Applying tape, protective strapping, or a brace may help prevent injury.  Provide the hand with support during sports and practice activities for 6 to  12 months following injury. PROGNOSIS  With proper treatment, healing should occur without impairment. The length of healing varies from 2 to 12 weeks, depending on the severity of injury. RELATED COMPLICATIONS   Longer healing time, if activities are resumed too soon.  Recurring symptoms or repeated injury, resulting in a chronic problem.  Injury to other nearby structures (bone, cartilage, tendon).  Arthritis of the knuckle (intermetacarpal) joint, with repeated sprains.  Prolonged disability (sometimes).  Hand and finger stiffness or weakness. TREATMENT Treatment first involves ice and medicine to reduce pain and inflammation. An elastic compression bandage may be worn to reduce discomfort and to protect the area. Depending on the severity of injury, you may be required to restrain the area with a cast, splint, or brace. After the ligament has been allowed to heal, strengthening and stretching exercises may be needed to regain strength and a full range of motion. Exercises may be completed at  home or with a therapist. Surgery is rarely needed. MEDICATION   If pain medicine is needed, nonsteroidal anti-inflammatory medicines (aspirin and ibuprofen), or other minor pain relievers (acetaminophen), are often advised.  Do not take pain medicine for 7 days before surgery.  Stronger pain relievers may be prescribed if your caregiver thinks they are needed. Use only as directed and only as much as you need. HEAT AND COLD  Cold treatment (icing) should be applied for 10 to 15 minutes every 2 to 3 hours for inflammation and pain, and immediately after activity that aggravates your symptoms. Use ice packs or an ice massage.  Heat treatment may be used before performing stretching and strengthening activities prescribed by your caregiver, physical therapist, or athletic trainer. Use a heat pack or a warm water soak. SEEK MEDICAL CARE IF:   Symptoms remain or get worse, despite treatment for  longer than 2 to 4 weeks.  You experience pain, numbness, discoloration, or coldness in the hand or fingers.  You develop blue, gray, or dark fingernails.  Any of the following occur after surgery: increased pain, swelling, redness, drainage of fluids, bleeding in the affected area, or signs of infection, including fever.  New, unexplained symptoms develop. (Drugs used in treatment may produce side effects.)   This information is not intended to replace advice given to you by your health care provider. Make sure you discuss any questions you have with your health care provider.   Document Released: 12/30/2004 Document Revised: 01/20/2014 Document Reviewed: 06/19/2014 Elsevier Interactive Patient Education 2016 Elsevier Inc. Intermetacarpal Sprain The intermetacarpal ligaments run between the knuckles at the base of the fingers. These ligaments are vulnerable to sprain and injury in which the ligament becomes overstretched or torn. Intermetacarpal sprains are classified into 3 categories. Grade 1 sprains cause pain, but the tendon is not lengthened. Grade 2 sprains include a lengthened ligament, due to the ligament being stretched or partially ruptured. With grade 2 sprains there is still function, although function may be decreased. Grade 3 sprains include a complete tear of the ligament, and the joint usually displays a loss of function.  SYMPTOMS   Severe pain at the time of injury.  Often, a feeling of popping or tearing inside the hand.  Tenderness and inflammation at the knuckles.  Bruising within a couple days of injury.  Impaired ability to use the hand. CAUSES  This condition occurs when the intermetacarpal ligaments are subjected to a greater stress than they can handle. This causes the ligaments to become stretched or torn. RISK INCREASES WITH:  Previous hand injury.  Fighting sports (boxing, wrestling, martial arts).  Sports in which you could fall on an outstretched  hand (soccer, basketball, volleyball).  Other sports with repeated hand trauma (water polo, gymnastics).  Poor hand strength and flexibility.  Inadequate or poorly fitted protective equipment. PREVENTION   Warm up and stretch properly before activity.  Maintain appropriate conditioning:  Hand flexibility.  Muscle strength and endurance.  Applying tape, protective strapping, or a brace may help prevent injury.  Provide the hand with support during sports and practice activities for 6 to 12 months following injury. PROGNOSIS  With proper treatment, healing should occur without impairment. The length of healing varies from 2 to 12 weeks, depending on the severity of injury. RELATED COMPLICATIONS   Longer healing time, if activities are resumed too soon.  Recurring symptoms or repeated injury, resulting in a chronic problem.  Injury to other nearby structures (bone, cartilage, tendon).  Arthritis of the knuckle (intermetacarpal) joint, with repeated sprains.  Prolonged disability (sometimes).  Hand and finger stiffness or weakness. TREATMENT Treatment first involves ice and medicine to reduce pain and inflammation. An elastic compression bandage may be worn to reduce discomfort and to protect the area. Depending on the severity of injury, you may be required to restrain the area with a cast, splint, or brace. After the ligament has been allowed to heal, strengthening and stretching exercises may be needed to regain strength and a full range of motion. Exercises may be completed at home or with a therapist. Surgery is rarely needed. MEDICATION   If pain medicine is needed, nonsteroidal anti-inflammatory medicines (aspirin and ibuprofen), or other minor pain relievers (acetaminophen), are often advised.  Do not take pain medicine for 7 days before surgery.  Stronger pain relievers may be prescribed if your caregiver thinks they are needed. Use only as directed and only as much as  you need. HEAT AND COLD  Cold treatment (icing) should be applied for 10 to 15 minutes every 2 to 3 hours for inflammation and pain, and immediately after activity that aggravates your symptoms. Use ice packs or an ice massage.  Heat treatment may be used before performing stretching and strengthening activities prescribed by your caregiver, physical therapist, or athletic trainer. Use a heat pack or a warm water soak. SEEK MEDICAL CARE IF:   Symptoms remain or get worse, despite treatment for longer than 2 to 4 weeks.  You experience pain, numbness, discoloration, or coldness in the hand or fingers.  You develop blue, gray, or dark fingernails.  Any of the following occur after surgery: increased pain, swelling, redness, drainage of fluids, bleeding in the affected area, or signs of infection, including fever.  New, unexplained symptoms develop. (Drugs used in treatment may produce side effects.)   This information is not intended to replace advice given to you by your health care provider. Make sure you discuss any questions you have with your health care provider.   Document Released: 12/30/2004 Document Revised: 01/20/2014 Document Reviewed: 06/19/2014 Elsevier Interactive Patient Education 2016 [place] DO? Elastic bandages come in different shapes and sizes. They generally provide support to your injury and reduce swelling while you are healing, but they can perform different functions. Your health care provider will help you to decide what is best for your protection, recovery, or rehabilitation following an injury. WHAT ARE SOME GENERAL TIPS FOR USING AN ELASTIC BANDAGE?  Use the bandage as directed by the maker of the bandage that you are using.  Do not wrap the bandage too tightly. This may cut off the circulation in the arm or leg in the area below the bandage.  If part of your body beyond the bandage becomes blue,  numb, cold, swollen, or is more painful, your bandage is most likely too tight. If this occurs, remove your bandage and reapply it more loosely.  See your health care provider if the bandage seems to be making your problems worse rather than better.  An elastic bandage should be removed and reapplied every 3-4 hours or as directed by your health care provider. WHAT IS RICE? The routine care of many injuries includes rest, ice, compression, and elevation (RICE therapy).  Rest Rest is required to allow your body to heal. Generally, you can resume your routine activities when you are comfortable and have been given permission by your health care provider. Ice Icing  your injury helps to keep the swelling down and it reduces pain. Do not apply ice directly to your skin.  Put ice in a plastic bag.  Place a towel between your skin and the bag.  Leave the ice on for 20 minutes, 2-3 times per day. Do this for as long as you are directed by your health care provider. Compression Compression helps to keep swelling down, gives support, and helps with discomfort. Compression may be done with an elastic bandage. Elevation Elevation helps to reduce swelling and it decreases pain. If possible, your injured area should be placed at or above the level of your heart or the center of your chest. [place]? You should seek medical care if:  You have persistent pain and swelling.  Your symptoms are getting worse rather than improving. These symptoms may indicate that further evaluation or further X-rays are needed. Sometimes, X-rays may not show a small broken bone (fracture) until a number of days later. Make a follow-up appointment with your health care provider. Ask when your X-ray results will be ready. Make sure that you get your X-ray results. WHEN SHOULD I SEEK IMMEDIATE MEDICAL CARE? You should seek immediate medical care if:  You have a sudden onset of severe pain at or below  the area of your injury.  You develop redness or increased swelling around your injury.  You have tingling or numbness at or below the area of your injury that does not improve after you remove the elastic bandage.   This information is not intended to replace advice given to you by your health care provider. Make sure you discuss any questions you have with your health care provider.   Document Released: 06/21/2001 Document Revised: 09/20/2014 Document Reviewed: 08/15/2013 Elsevier Interactive Patient Education [employer].

## 2015-03-12 ENCOUNTER — Other Ambulatory Visit: Payer: Self-pay | Admitting: Family Medicine

## 2015-03-12 DIAGNOSIS — Z1231 Encounter for screening mammogram for malignant neoplasm of breast: Secondary | ICD-10-CM

## 2015-05-24 ENCOUNTER — Ambulatory Visit: Payer: Self-pay

## 2015-06-07 ENCOUNTER — Ambulatory Visit
Admission: RE | Admit: 2015-06-07 | Discharge: 2015-06-07 | Disposition: A | Discharge status: Home / Self Care | Payer: 59 | Source: Ambulatory Visit | Attending: Family Medicine | Admitting: Family Medicine

## 2015-06-07 DIAGNOSIS — Z1231 Encounter for screening mammogram for malignant neoplasm of breast: Secondary | ICD-10-CM

## 2016-02-11 ENCOUNTER — Emergency Department (HOSPITAL_COMMUNITY): Payer: Worker's Compensation

## 2016-02-11 ENCOUNTER — Emergency Department (HOSPITAL_COMMUNITY)
Admission: EM | Admit: 2016-02-11 | Discharge: 2016-02-11 | Disposition: A | Discharge status: Home / Self Care | Payer: Worker's Compensation | Attending: Emergency Medicine | Admitting: Emergency Medicine

## 2016-02-11 ENCOUNTER — Encounter (HOSPITAL_COMMUNITY): Payer: Self-pay

## 2016-02-11 DIAGNOSIS — M62838 Other muscle spasm: Secondary | ICD-10-CM | POA: Insufficient documentation

## 2016-02-11 DIAGNOSIS — Z79899 Other long term (current) drug therapy: Secondary | ICD-10-CM | POA: Diagnosis not present

## 2016-02-11 DIAGNOSIS — F0781 Postconcussional syndrome: Secondary | ICD-10-CM | POA: Diagnosis not present

## 2016-02-11 DIAGNOSIS — G44309 Post-traumatic headache, unspecified, not intractable: Secondary | ICD-10-CM | POA: Insufficient documentation

## 2016-02-11 DIAGNOSIS — W19XXXD Unspecified fall, subsequent encounter: Secondary | ICD-10-CM | POA: Insufficient documentation

## 2016-02-11 DIAGNOSIS — I10 Essential (primary) hypertension: Secondary | ICD-10-CM | POA: Diagnosis not present

## 2016-02-11 DIAGNOSIS — R51 Headache: Secondary | ICD-10-CM | POA: Diagnosis present

## 2016-02-11 HISTORY — DX: Essential (primary) hypertension: I10

## 2016-02-11 MED ORDER — DICLOFENAC SODIUM 1 % TD GEL: 2.0000 g | Freq: Four times a day (QID) | TRANSDERMAL | 0 refills | Status: DC

## 2016-02-11 MED ORDER — CYCLOBENZAPRINE HCL 10 MG PO TABS: 10.0000 mg | ORAL_TABLET | Freq: Three times a day (TID) | ORAL | 0 refills | Status: DC | PRN

## 2016-02-11 NOTE — ED Notes (Signed)
Pt. Up to bathroom without assistance.

## 2016-02-11 NOTE — ED Notes (Signed)
Patient transported to CT 

## 2016-02-11 NOTE — ED Triage Notes (Signed)
[place] continuing to have neck and back soreness, alert and oriented

## 2016-02-11 NOTE — Discharge Instructions (Signed)
Read the information below.  Use the prescribed medication as directed.  Please discuss all new medications with your pharmacist.  You may return to the Emergency Department at any time for worsening condition or any new symptoms that concern you.    ° °You have had a head injury which does not appear to require admission at this time. A concussion is a state of changed mental ability from trauma. °SEEK IMMEDIATE MEDICAL ATTENTION IF: °There is confusion or drowsiness (although children frequently become drowsy after injury).  °You cannot awaken the injured person.  °There is nausea (feeling sick to your stomach) or continued, forceful vomiting.  °You notice dizziness or unsteadiness which is getting worse, or inability to walk.  °You have convulsions or unconsciousness.  °You experience severe, persistent headaches not relieved by Tylenol?. (Do not take aspirin as this impairs clotting abilities). Take other pain medications only as directed.  °You cannot use arms or legs normally.  °There are changes in pupil sizes. (This is the black center in the colored part of the eye)  °There is clear or bloody discharge from the nose or ears.  °Change in speech, vision, swallowing, or understanding.  °Localized weakness, numbness, tingling, or change in bowel or bladder control.  °

## 2016-02-11 NOTE — ED Notes (Signed)
Returned from CT.

## 2016-02-11 NOTE — ED Provider Notes (Signed)
[facility] DEPT Provider Note   CSN: [id number] Arrival date & time: 02/11/16  1246     History   Chief Complaint Chief Complaint  Patient presents with  . fall 1/13    HPI Monique Robinson is a 56 y.o. female.  HPI   Pt with hx colon cancer, HTN p/w headache, facial pain, neck pain, low back pain, tingling in her bilateral finger tips following a fall on 01/26/16.  States a colleague chest bumped her causing her to fly through the air and land on her head and back.  She was dizzy afterward.  Has had gradually worsening headache and neck pain.  She vomited once and had blurry vision once a few days after the episode.  She now has tingling in the fingertips of her bilateral hands.  Has been taking epsom salt baths and taking tylenol without improvement.  Denies fevers, URI symptoms, persistent vomiting, focal neurologic deficits.    Past Medical History:  Diagnosis Date  . Cancer ([place])   . Hypertension     There are no active problems to display for this patient.   Past Surgical History:  Procedure Laterality Date  . COLON SURGERY      OB History    No data available       Home Medications    Prior to Admission medications   Medication Sig Start Date End Date Taking? Authorizing Provider  cyclobenzaprine (FLEXERIL) 10 MG tablet Take 1 tablet (10 mg total) by mouth 3 (three) times daily as needed for muscle spasms (or pain). 02/11/16   Clayton Bibles, PA-C  diclofenac sodium (VOLTAREN) 1 % GEL Apply 2 g topically 4 (four) times daily. 02/11/16   Clayton Bibles, PA-C  rosuvastatin (CRESTOR) 10 MG tablet Take 10 mg by mouth Nightly.    Historical Provider, MD  valsartan-hydrochlorothiazide (DIOVAN-HCT) 320-12.5 MG tablet Take 1 tablet by mouth daily.    Historical Provider, MD    Family History No family history on file.  Social History Social History  Substance Use Topics  . Smoking status: Never Smoker  . Smokeless tobacco: Not on file  . Alcohol use No      Allergies   Patient has no known allergies.   Review of Systems Review of Systems  All other systems reviewed and are negative.    Physical Exam Updated Vital Signs BP 143/89 (BP Location: Right Arm)   Pulse 76   Temp 98.3 F (36.8 C) (Oral)   Resp 17   SpO2 100%   Physical Exam  Constitutional: She appears well-developed and well-nourished. No distress.  HENT:  Head: Normocephalic and atraumatic.  Neck: Neck supple.  Pulmonary/Chest: Effort normal.  Musculoskeletal:  Pain with ROM of neck, mild tenderness over inferior cervical spine.  Tenderness and tightness of bilateral trapezius.    Neurological: She is alert.  CN II-XII intact, EOMs intact, no pronator drift, grip strengths equal bilaterally; strength 5/5 in all extremities, sensation intact in all extremities; finger to nose, heel to shin, rapid alternating movements normal.     Skin: She is not diaphoretic.  Nursing note and vitals reviewed.    ED Treatments / Results  Labs (all labs ordered are listed, but only abnormal results are displayed) Labs Reviewed - No data to display  EKG  EKG Interpretation None       Radiology Ct Head Wo Contrast  Result Date: 02/11/2016 CLINICAL DATA:  Status post fall, worsening condition, concussive symptoms, tingling in the finger tips EXAM: CT  HEAD WITHOUT CONTRAST CT CERVICAL SPINE WITHOUT CONTRAST TECHNIQUE: Multidetector CT imaging of the head and cervical spine was performed following the standard protocol without intravenous contrast. Multiplanar CT image reconstructions of the cervical spine were also generated. COMPARISON:  None. FINDINGS: CT HEAD FINDINGS Brain: No evidence of acute infarction, hemorrhage, hydrocephalus, extra-axial collection or mass lesion/mass effect. Generalized cerebral atrophy. Vascular: No hyperdense vessel or unexpected calcification. Skull: No osseous abnormality. Sinuses/Orbits: Small right maxillary sinus air-fluid level. Visualized  mastoid sinuses are clear. Visualized orbits demonstrate no focal abnormality. Other: None CT CERVICAL SPINE FINDINGS Alignment: Normal. Skull base and vertebrae: No acute fracture. No primary bone lesion or focal pathologic process. Soft tissues and spinal canal: No prevertebral fluid or swelling. No visible canal hematoma. Disc levels: Degenerative disc disease with mild disc height loss at C3-4, C4-5, C5-6 and C6-7. Upper chest: Lung apices are clear. Other: No fluid collection or hematoma. IMPRESSION: 1. No acute intracranial pathology. 2. No acute osseous injury of the cervical spine. Electronically Signed   By: Kathreen Devoid   On: 02/11/2016 17:42   Ct Cervical Spine Wo Contrast  Result Date: 02/11/2016 CLINICAL DATA:  Status post fall, worsening condition, concussive symptoms, tingling in the finger tips EXAM: CT HEAD WITHOUT CONTRAST CT CERVICAL SPINE WITHOUT CONTRAST TECHNIQUE: Multidetector CT imaging of the head and cervical spine was performed following the standard protocol without intravenous contrast. Multiplanar CT image reconstructions of the cervical spine were also generated. COMPARISON:  None. FINDINGS: CT HEAD FINDINGS Brain: No evidence of acute infarction, hemorrhage, hydrocephalus, extra-axial collection or mass lesion/mass effect. Generalized cerebral atrophy. Vascular: No hyperdense vessel or unexpected calcification. Skull: No osseous abnormality. Sinuses/Orbits: Small right maxillary sinus air-fluid level. Visualized mastoid sinuses are clear. Visualized orbits demonstrate no focal abnormality. Other: None CT CERVICAL SPINE FINDINGS Alignment: Normal. Skull base and vertebrae: No acute fracture. No primary bone lesion or focal pathologic process. Soft tissues and spinal canal: No prevertebral fluid or swelling. No visible canal hematoma. Disc levels: Degenerative disc disease with mild disc height loss at C3-4, C4-5, C5-6 and C6-7. Upper chest: Lung apices are clear. Other: No fluid  collection or hematoma. IMPRESSION: 1. No acute intracranial pathology. 2. No acute osseous injury of the cervical spine. Electronically Signed   By: Kathreen Devoid   On: 02/11/2016 17:42    Procedures Procedures (including critical care time)  Medications Ordered in ED Medications - No data to display   Initial Impression / Assessment and Plan / ED Course  I have reviewed the triage vital signs and the nursing notes.  Pertinent labs & imaging results that were available during my care of the patient were reviewed by me and considered in my medical decision making (see chart for details).     Afebrile, nontoxic patient with injury to her head/neck/back while falling backwards on 1/13, reporting worsening symptoms.  No prior treatment. CTs negative.  Suspect mild postconcussive syndrome with upper back muscle spasm.   D/C home with symptomatic medications, PCP follow up.  Discussed result, findings, treatment, and follow up  with patient.  Pt given return precautions.  Pt verbalizes understanding and agrees with plan.      Final Clinical Impressions(s) / ED Diagnoses   Final diagnoses:  Postconcussive syndrome  Muscle spasm    New Prescriptions Discharge Medication List as of 02/11/2016  6:09 PM    START taking these medications   Details  cyclobenzaprine (FLEXERIL) 10 MG tablet Take 1 tablet (10 mg total) by mouth  3 (three) times daily as needed for muscle spasms (or pain)., Starting Mon 02/11/2016, Print    diclofenac sodium (VOLTAREN) 1 % GEL Apply 2 g topically 4 (four) times daily., Starting Mon 02/11/2016, Print         [place], PA-C 02/11/16 1953    Gareth Morgan, MD 02/12/16 [phone]

## 2017-01-20 DIAGNOSIS — M419 Scoliosis, unspecified: Secondary | ICD-10-CM | POA: Insufficient documentation

## 2017-01-20 DIAGNOSIS — M4316 Spondylolisthesis, lumbar region: Secondary | ICD-10-CM | POA: Insufficient documentation

## 2017-01-20 DIAGNOSIS — M545 Low back pain, unspecified: Secondary | ICD-10-CM | POA: Insufficient documentation

## 2017-10-12 ENCOUNTER — Ambulatory Visit
Admission: RE | Admit: 2017-10-12 | Discharge: 2017-10-12 | Disposition: A | Discharge status: Home / Self Care | Payer: BLUE CROSS/BLUE SHIELD | Source: Ambulatory Visit | Attending: Internal Medicine | Admitting: Internal Medicine

## 2017-10-12 ENCOUNTER — Other Ambulatory Visit: Payer: Self-pay | Admitting: Internal Medicine

## 2017-10-12 DIAGNOSIS — R1084 Generalized abdominal pain: Secondary | ICD-10-CM

## 2017-10-12 DIAGNOSIS — E663 Overweight: Secondary | ICD-10-CM | POA: Diagnosis not present

## 2017-10-12 DIAGNOSIS — R1032 Left lower quadrant pain: Secondary | ICD-10-CM | POA: Diagnosis not present

## 2017-10-12 DIAGNOSIS — E7800 Pure hypercholesterolemia, unspecified: Secondary | ICD-10-CM | POA: Diagnosis not present

## 2017-10-12 DIAGNOSIS — Z0000 Encounter for general adult medical examination without abnormal findings: Secondary | ICD-10-CM | POA: Diagnosis not present

## 2017-10-12 DIAGNOSIS — I10 Essential (primary) hypertension: Secondary | ICD-10-CM | POA: Diagnosis not present

## 2017-10-23 DIAGNOSIS — R000 Tachycardia, unspecified: Secondary | ICD-10-CM | POA: Diagnosis not present

## 2017-10-23 DIAGNOSIS — R197 Diarrhea, unspecified: Secondary | ICD-10-CM | POA: Diagnosis not present

## 2017-10-23 DIAGNOSIS — R112 Nausea with vomiting, unspecified: Secondary | ICD-10-CM | POA: Diagnosis not present

## 2017-10-23 DIAGNOSIS — R109 Unspecified abdominal pain: Secondary | ICD-10-CM | POA: Diagnosis not present

## 2017-12-29 DIAGNOSIS — L709 Acne, unspecified: Secondary | ICD-10-CM | POA: Diagnosis not present

## 2017-12-29 DIAGNOSIS — L819 Disorder of pigmentation, unspecified: Secondary | ICD-10-CM | POA: Diagnosis not present

## 2018-06-30 DIAGNOSIS — Z01419 Encounter for gynecological examination (general) (routine) without abnormal findings: Secondary | ICD-10-CM | POA: Diagnosis not present

## 2018-06-30 DIAGNOSIS — Z124 Encounter for screening for malignant neoplasm of cervix: Secondary | ICD-10-CM | POA: Diagnosis not present

## 2018-06-30 DIAGNOSIS — Z6841 Body Mass Index (BMI) 40.0 and over, adult: Secondary | ICD-10-CM | POA: Diagnosis not present

## 2018-07-02 DIAGNOSIS — Z1231 Encounter for screening mammogram for malignant neoplasm of breast: Secondary | ICD-10-CM | POA: Diagnosis not present

## 2019-01-05 DIAGNOSIS — Z1211 Encounter for screening for malignant neoplasm of colon: Secondary | ICD-10-CM | POA: Diagnosis not present

## 2019-01-20 DIAGNOSIS — Z1211 Encounter for screening for malignant neoplasm of colon: Secondary | ICD-10-CM | POA: Diagnosis not present

## 2019-01-20 DIAGNOSIS — K5730 Diverticulosis of large intestine without perforation or abscess without bleeding: Secondary | ICD-10-CM | POA: Diagnosis not present

## 2019-04-03 ENCOUNTER — Other Ambulatory Visit: Payer: Self-pay | Admitting: Dermatology

## 2019-04-04 ENCOUNTER — Telehealth: Payer: Self-pay

## 2019-04-04 NOTE — Telephone Encounter (Signed)
Flagyl rx sent in error.  Called [facility] to cancel order.  Patient needs appointment for refills.

## 2021-03-30 ENCOUNTER — Encounter (HOSPITAL_COMMUNITY): Payer: Self-pay | Admitting: Emergency Medicine

## 2021-03-30 ENCOUNTER — Ambulatory Visit (HOSPITAL_COMMUNITY)
Admission: EM | Admit: 2021-03-30 | Discharge: 2021-03-30 | Disposition: A | Discharge status: Home / Self Care | Payer: BC Managed Care – PPO | Attending: Urgent Care | Admitting: Urgent Care

## 2021-03-30 DIAGNOSIS — R112 Nausea with vomiting, unspecified: Secondary | ICD-10-CM | POA: Diagnosis not present

## 2021-03-30 DIAGNOSIS — R519 Headache, unspecified: Secondary | ICD-10-CM | POA: Diagnosis not present

## 2021-03-30 DIAGNOSIS — Z85038 Personal history of other malignant neoplasm of large intestine: Secondary | ICD-10-CM | POA: Insufficient documentation

## 2021-03-30 DIAGNOSIS — L83 Acanthosis nigricans: Secondary | ICD-10-CM | POA: Diagnosis not present

## 2021-03-30 DIAGNOSIS — L98491 Non-pressure chronic ulcer of skin of other sites limited to breakdown of skin: Secondary | ICD-10-CM | POA: Diagnosis not present

## 2021-03-30 DIAGNOSIS — Z20822 Contact with and (suspected) exposure to covid-19: Secondary | ICD-10-CM | POA: Diagnosis present

## 2021-03-30 MED ORDER — ONDANSETRON 4 MG PO TBDP: 4.0000 mg | ORAL_TABLET | Freq: Three times a day (TID) | ORAL | 0 refills | Status: DC | PRN

## 2021-03-30 NOTE — Discharge Instructions (Addendum)
You were tested today for covid due to your known exposure. Do not return to work until instructed to do so. You would be a candidate for immune therapy if the test is positive. We will call when the results are received. ? ?Please purchase OTC Endit cream, which can be used over the open skin on your right side of your abdomen. Follow up with your PCP next week for any further recommendations. ? ?The skin on your neck is a rash called "Acanthosis Nigricans." This indicated insulin resistance, or possibly diabetes. Please have labs tested on Thursday by your PCP. The best treatment for the rash is the underlying cause (ie: insulin resistance.) In rare cases, Retin-A can be prescribed, but this is not first line treatment. Please discuss with PCP. ? ?I have called in zofran for you to use PRN nausea/ vomiting. Place this medication under your tongue as needed. ?

## 2021-03-30 NOTE — ED Provider Notes (Signed)
?[facility] ? ? ? ?CSN: [id number] ?Arrival date & time: 03/30/21  1241 ? ? ?  ? ?History   ?Chief Complaint ?Chief Complaint  ?Patient presents with  ? Covid Exposure  ? Rash  ? ? ?HPI ?Monique Robinson is a 61 y.o. female.  ? ?Pleasant 61 year old female presents today with primary concern of exposure to [place].  She states her exposure was roughly 3 days ago, and for the past 2 days has been experiencing vomiting nausea and a headache. She denies any change to Bms. She denies cough or shortness of breath.  She has never had COVID before, she has been vaccinated against COVID but has not had the booster.  She is concerned as she is high risk, has a history of colon cancer treated with colectomy back in 2006.  Her only prescription medications are for blood pressure.  ?Patient also notes a ulcer to her right abdomen.  She states the ulcer has been been present for the past month, and is concerned because she has been having internal and external pain.  She has a follow-up with her PCP on Thursday.  The ulcer is in the location of a previous colectomy incision site.  She denies any drainage from the lesion.  She has not applied any topical treatments.  ?Lastly pt is concerned about a dark appearing rash on the back of her neck. She states it is getting darker and occasionally itches. ? ? ?Rash ?Associated symptoms: headaches, nausea and vomiting   ? ?Past Medical History:  ?Diagnosis Date  ? Cancer [facility])   ? Hypertension   ? ? ?Patient Active Problem List  ? Diagnosis Date Noted  ? Spondylolisthesis, lumbar region 01/20/2017  ? Low back pain 01/20/2017  ? Scoliosis of lumbar spine 01/20/2017  ? Hypertension 09/24/2011  ? ? ?Past Surgical History:  ?Procedure Laterality Date  ? COLON SURGERY    ? ? ?OB History   ?No obstetric history on file. ?  ? ? ? ?Home Medications   ? ?Prior to Admission medications   ?Medication Sig Start Date End Date Taking? Authorizing Provider  ?amLODipine (NORVASC) 5 MG tablet Take  5 mg by mouth daily.   Yes [provider]  ?ondansetron (ZOFRAN-ODT) 4 MG disintegrating tablet Take 1 tablet (4 mg total) by mouth every 8 (eight) hours as needed for nausea or vomiting. 03/30/21  Yes Sahib Pella L, PA  ?valsartan-hydrochlorothiazide (DIOVAN-HCT) 320-12.5 MG tablet Take 1 tablet by mouth daily.    [provider]  ? ? ?Family History ?History reviewed. No pertinent family history. ? ?Social History ?Social History  ? ?Tobacco Use  ? Smoking status: Never  ?Substance Use Topics  ? Alcohol use: No  ?  Alcohol/week: 0.0 standard drinks  ? Drug use: No  ? ? ? ?Allergies   ?Olmesartan-amlodipine-hctz ? ? ?Review of Systems ?Review of Systems  ?Gastrointestinal:  Positive for nausea and vomiting.  ?Skin:  Positive for wound.  ?Neurological:  Positive for headaches.  ?All other systems reviewed and are negative. ? ? ?Physical Exam ?Triage Vital Signs ?ED Triage Vitals  ?Enc Vitals Group  ?   BP 03/30/21 1407 124/89  ?   Pulse Rate 03/30/21 1407 92  ?   Resp 03/30/21 1407 18  ?   Temp 03/30/21 1407 98.2 ?F (36.8 ?C)  ?   Temp Source 03/30/21 1407 Oral  ?   SpO2 03/30/21 1407 97 %  ?   Weight --   ?  Height --   ?   Head Circumference --   ?   Peak Flow --   ?   Pain Score 03/30/21 1405 8  ?   Pain Loc --   ?   Pain Edu? --   ?   Excl. in [place]? --   ? ?No data found. ? ?Updated Vital Signs ?BP 124/89 (BP Location: Right Arm)   Pulse 92   Temp 98.2 ?F (36.8 ?C) (Oral)   Resp 18   SpO2 97%  ? ?Visual Acuity ?Right Eye Distance:   ?Left Eye Distance:   ?Bilateral Distance:   ? ?Right Eye Near:   ?Left Eye Near:    ?Bilateral Near:    ? ?Physical Exam ?Vitals and nursing note reviewed.  ?Constitutional:   ?   General: She is not in acute distress. ?   Appearance: Normal appearance. She is obese. She is not ill-appearing, toxic-appearing or diaphoretic.  ?HENT:  ?   Head: Normocephalic and atraumatic.  ?   Nose:  ?   Comments: Mask in place ?Eyes:  ?   Extraocular Movements: Extraocular  movements intact.  ?   Conjunctiva/sclera: Conjunctivae normal.  ?   Pupils: Pupils are equal, round, and reactive to light.  ?Cardiovascular:  ?   Rate and Rhythm: Normal rate.  ?Pulmonary:  ?   Effort: Pulmonary effort is normal. No respiratory distress.  ?   Breath sounds: Normal breath sounds.  ?Abdominal:  ?   General: Abdomen is flat. Bowel sounds are normal. There is no distension.  ?   Palpations: Abdomen is soft.  ?   Tenderness: There is no abdominal tenderness. There is no guarding.  ?   Comments: Dime-sized ulceration through skin only on   ?Musculoskeletal:     ?   General: No swelling.  ?   Cervical back: Normal range of motion and neck supple. No rigidity or tenderness.  ?   Right lower leg: No edema.  ?   Left lower leg: No edema.  ?Lymphadenopathy:  ?   Cervical: No cervical adenopathy.  ?Skin: ?   General: Skin is warm and dry.  ?   Capillary Refill: Capillary refill takes less than 2 seconds.  ?   Findings: Lesion (single dime-sized ulceration to R intertrigonal fold of abdomen) and rash (acanthosis nigricans with acrochordons) present. No erythema.  ?Neurological:  ?   General: No focal deficit present.  ?   Mental Status: She is alert and oriented to person, place, and time.  ?Psychiatric:     ?   Mood and Affect: Mood normal.     ?   Behavior: Behavior normal.  ? ? ? ?UC Treatments / Results  ?Labs ?(all labs ordered are listed, but only abnormal results are displayed) ?Labs Reviewed  ?SARS CORONAVIRUS 2 (TAT 6-24 HRS)  ? ? ?EKG ? ? ?Radiology ?No results found. ? ?Procedures ?Procedures (including critical care time) ? ?Medications Ordered in UC ?Medications - No data to display ? ?Initial Impression / Assessment and Plan / UC Course  ?I have reviewed the triage vital signs and the nursing notes. ? ?Pertinent labs & imaging results that were available during my care of the patient were reviewed by me and considered in my medical decision making (see chart for details). ? ?  ? ?Exposure to  Covid 19 - test performed today. Pt is high risk given HTN and hx of cancer. Pt would require antiviral treatment if positive.  ?Headache /  NV - possible sequellae to covid vs another viral infection. Supportive care. Zofran called in. Rest, hydration, PRN tylenol ?Skin ulcer - per pt, has been present one month. I do not see evidence of secondary infection. This looks to be due to pressure from her abdominal fold. I recommended pt purchase OTC Endit cream. Has f/u with her PCP on Thursday to ensure this is not a repeat of her hx of colon cancer. ?Acanthosis nigricans - needs DM/ insulin resistance f/u with PCP. May do trial of Retin-A, although likely would not be covered by insurance as this is a cosmetic treatment.  ? ?Final Clinical Impressions(s) / UC Diagnoses  ? ?Final diagnoses:  ?Exposure to COVID-19 virus  ?Acute nonintractable headache, unspecified headache type  ?Nausea and vomiting, unspecified vomiting type  ?Skin ulcer, limited to breakdown of skin ([place])  ?History of colon cancer  ?Acanthosis nigricans  ? ? ? ?Discharge Instructions   ? ?  ?You were tested today for covid due to your known exposure. Do not return to work until instructed to do so. You would be a candidate for immune therapy if the test is positive. We will call when the results are received. ? ?Please purchase OTC Endit cream, which can be used over the open skin on your right side of your abdomen. Follow up with your PCP next week for any further recommendations. ? ?The skin on your neck is a rash called "Acanthosis Nigricans." This indicated insulin resistance, or possibly diabetes. Please have labs tested on Thursday by your PCP. The best treatment for the rash is the underlying cause (ie: insulin resistance.) In rare cases, Retin-A can be prescribed, but this is not first line treatment. Please discuss with PCP. ? ?I have called in zofran for you to use PRN nausea/ vomiting. Place this medication under your tongue as  needed. ? ? ? ? ?ED Prescriptions   ? ? Medication Sig Dispense Auth. Provider  ? ondansetron (ZOFRAN-ODT) 4 MG disintegrating tablet Take 1 tablet (4 mg total) by mouth every 8 (eight) hours as needed for nausea or vomit

## 2021-03-30 NOTE — ED Triage Notes (Signed)
Pt reports exposed to covid 2 days ago, then started having chills, cough, vomiting and sore throat.  ? ?Reports rash on posterior neck started about 1-2 weeks ago that itches. Pharmacy told to try cortisone.  ? ?Pt reports that at older incision site (from colon cancer) has open are that was scabbed over. Reports that has inner and outer pain.  ?

## 2021-03-31 LAB — SARS CORONAVIRUS 2 (TAT 6-24 HRS): SARS Coronavirus 2: NEGATIVE

## 2021-04-13 LAB — HM PAP SMEAR: HPV, high-risk: NEGATIVE

## 2022-04-08 LAB — HM MAMMOGRAPHY

## 2022-09-08 ENCOUNTER — Encounter: Payer: BLUE CROSS/BLUE SHIELD | Admitting: Internal Medicine

## 2023-03-25 ENCOUNTER — Ambulatory Visit: Payer: Self-pay | Admitting: Family Medicine

## 2023-05-06 ENCOUNTER — Other Ambulatory Visit: Payer: Self-pay | Admitting: Family Medicine

## 2023-05-06 ENCOUNTER — Ambulatory Visit: Payer: Self-pay | Admitting: Family Medicine

## 2023-05-06 ENCOUNTER — Encounter: Payer: Self-pay | Admitting: Family Medicine

## 2023-05-06 VITALS — BP 130/90 | HR 60 | Temp 98.2°F | Ht 63.0 in | Wt 237.0 lb

## 2023-05-06 DIAGNOSIS — E785 Hyperlipidemia, unspecified: Secondary | ICD-10-CM | POA: Diagnosis not present

## 2023-05-06 DIAGNOSIS — Z1231 Encounter for screening mammogram for malignant neoplasm of breast: Secondary | ICD-10-CM

## 2023-05-06 DIAGNOSIS — I10 Essential (primary) hypertension: Secondary | ICD-10-CM

## 2023-05-06 DIAGNOSIS — R7309 Other abnormal glucose: Secondary | ICD-10-CM

## 2023-05-06 DIAGNOSIS — Z0000 Encounter for general adult medical examination without abnormal findings: Secondary | ICD-10-CM | POA: Diagnosis not present

## 2023-05-06 DIAGNOSIS — Z114 Encounter for screening for human immunodeficiency virus [HIV]: Secondary | ICD-10-CM

## 2023-05-06 DIAGNOSIS — Z2821 Immunization not carried out because of patient refusal: Secondary | ICD-10-CM

## 2023-05-06 DIAGNOSIS — E66813 Obesity, class 3: Secondary | ICD-10-CM

## 2023-05-06 DIAGNOSIS — Z1159 Encounter for screening for other viral diseases: Secondary | ICD-10-CM

## 2023-05-06 DIAGNOSIS — Z7689 Persons encountering health services in other specified circumstances: Secondary | ICD-10-CM

## 2023-05-06 DIAGNOSIS — Z6841 Body Mass Index (BMI) 40.0 and over, adult: Secondary | ICD-10-CM

## 2023-05-06 LAB — POCT URINALYSIS DIP (CLINITEK)
Bilirubin, UA: NEGATIVE
Blood, UA: NEGATIVE
Glucose, UA: NEGATIVE mg/dL
Ketones, POC UA: NEGATIVE mg/dL
Nitrite, UA: NEGATIVE
POC PROTEIN,UA: NEGATIVE
Spec Grav, UA: 1.02 (ref 1.010–1.025)
Urobilinogen, UA: 1 U/dL
pH, UA: 7.5 (ref 5.0–8.0)

## 2023-05-06 MED ORDER — WEGOVY 0.25 MG/0.5ML ~~LOC~~ SOAJ: 0.2500 mg | SUBCUTANEOUS | 1 refills | Status: DC

## 2023-05-06 NOTE — Patient Instructions (Signed)
 Hypertension, Adult Hypertension is another name for high blood pressure. High blood pressure forces your heart to work harder to pump blood. This can cause problems over time. There are two numbers in a blood pressure reading. There is a top number (systolic) over a bottom number (diastolic). It is best to have a blood pressure that is below 120/80. What are the causes? The cause of this condition is not known. Some other conditions can lead to high blood pressure. What increases the risk? Some lifestyle factors can make you more likely to develop high blood pressure: Smoking. Not getting enough exercise or physical activity. Being overweight. Having too much fat, sugar, calories, or salt (sodium) in your diet. Drinking too much alcohol. Other risk factors include: Having any of these conditions: Heart disease. Diabetes. High cholesterol. Kidney disease. Obstructive sleep apnea. Having a family history of high blood pressure and high cholesterol. Age. The risk increases with age. Stress. What are the signs or symptoms? High blood pressure may not cause symptoms. Very high blood pressure (hypertensive crisis) may cause: Headache. Fast or uneven heartbeats (palpitations). Shortness of breath. Nosebleed. Vomiting or feeling like you may vomit (nauseous). Changes in how you see. Very bad chest pain. Feeling dizzy. Seizures. How is this treated? This condition is treated by making healthy lifestyle changes, such as: Eating healthy foods. Exercising more. Drinking less alcohol. Your doctor may prescribe medicine if lifestyle changes do not help enough and if: Your top number is above 130. Your bottom number is above 80. Your personal target blood pressure may vary. Follow these instructions at home: Eating and drinking  If told, follow the DASH eating plan. To follow this plan: Fill one half of your plate at each meal with fruits and vegetables. Fill one fourth of your plate  at each meal with whole grains. Whole grains include whole-wheat pasta, brown rice, and whole-grain bread. Eat or drink low-fat dairy products, such as skim milk or low-fat yogurt. Fill one fourth of your plate at each meal with low-fat (lean) proteins. Low-fat proteins include fish, chicken without skin, eggs, beans, and tofu. Avoid fatty meat, cured and processed meat, or chicken with skin. Avoid pre-made or processed food. Limit the amount of salt in your diet to less than 1,500 mg each day. Do not drink alcohol if: Your doctor tells you not to drink. You are pregnant, may be pregnant, or are planning to become pregnant. If you drink alcohol: Limit how much you have to: 0-1 drink a day for women. 0-2 drinks a day for men. Know how much alcohol is in your drink. In the [place] one drink equals one 12 oz bottle of beer (355 mL), one 5 oz glass of wine (148 mL), or one 1 oz glass of hard liquor (44 mL). Lifestyle  Work with your doctor to stay at a healthy weight or to lose weight. Ask your doctor what the best weight is for you. Get at least 30 minutes of exercise that causes your heart to beat faster (aerobic exercise) most days of the week. This may include walking, swimming, or biking. Get at least 30 minutes of exercise that strengthens your muscles (resistance exercise) at least 3 days a week. This may include lifting weights or doing Pilates. Do not smoke or use any products that contain nicotine or tobacco. If you need help quitting, ask your doctor. Check your blood pressure at home as told by your doctor. Keep all follow-up visits. Medicines Take over-the-counter and prescription medicines  only as told by your doctor. Follow directions carefully. Do not skip doses of blood pressure medicine. The medicine does not work as well if you skip doses. Skipping doses also puts you at risk for problems. Ask your doctor about side effects or reactions to medicines that you should watch  for. Contact a doctor if: You think you are having a reaction to the medicine you are taking. You have headaches that keep coming back. You feel dizzy. You have swelling in your ankles. You have trouble with your vision. Get help right away if: You get a very bad headache. You start to feel mixed up (confused). You feel weak or numb. You feel faint. You have very bad pain in your: Chest. Belly (abdomen). You vomit more than once. You have trouble breathing. These symptoms may be an emergency. Get help right away. Call 911. Do not wait to see if the symptoms will go away. Do not drive yourself to the hospital. Summary Hypertension is another name for high blood pressure. High blood pressure forces your heart to work harder to pump blood. For most people, a normal blood pressure is less than 120/80. Making healthy choices can help lower blood pressure. If your blood pressure does not get lower with healthy choices, you may need to take medicine. This information is not intended to replace advice given to you by your health care provider. Make sure you discuss any questions you have with your health care provider. Document Revised: 10/18/2020 Document Reviewed: 10/18/2020 Elsevier Patient Education  2024 [employer].

## 2023-05-06 NOTE — Progress Notes (Signed)
 I,[name], CMA,acting as a Neurosurgeon for [employer], NP.,have documented all relevant documentation on the behalf of Melodie Spry, NP,as directed by  Melodie Spry, NP while in the presence of Melodie Spry, NP.  Subjective:  Patient ID: Monique Robinson , female    DOB: [birth year] , 63 y.o.   MRN: [id number]  Chief Complaint  Patient presents with   Establish Care   Hypertension   Weight Check    HPI  Patient is a 63 year old who presents today to establish care and get her annual physical. Patient has been seeing her GYN Dr.Dillard for her Women's health and she has been treating her for high blood pressure so she would like to be get a PCP.  Patient states that due to polyp found in her colonoscopy she gets them done every 5 years , last was 01/2019 with Dr Nickey Barn.     Past Medical History:  Diagnosis Date   Cancer [facility])    Colon cancer (HCC) 2006   Hypertension      Family History  Problem Relation Age of Onset   Hypertension Mother    Breast cancer Maternal Grandmother      Current Outpatient Medications:    amLODipine (NORVASC) 10 MG tablet, Take 10 mg by mouth daily., Disp: , Rfl:    aspirin EC 81 MG tablet, Take 81 mg by mouth daily. Swallow whole., Disp: , Rfl:    losartan-hydrochlorothiazide (HYZAAR) 100-25 MG tablet, Take 1 tablet by mouth daily., Disp: , Rfl:    Semaglutide -Weight Management (WEGOVY ) 0.25 MG/0.5ML SOAJ, Inject 0.25 mg into the skin every 7 (seven) days., Disp: 2 mL, Rfl: 1   Allergies  Allergen Reactions   Olmesartan-Amlodipine-Hctz      Review of Systems  Constitutional: Negative.   HENT: Negative.    Eyes: Negative.   Respiratory: Negative.    Cardiovascular: Negative.   Gastrointestinal: Negative.   Endocrine: Negative.   Genitourinary: Negative.   Musculoskeletal: Negative.   Skin: Negative.   Allergic/Immunologic: Negative.   Neurological: Negative.   Hematological: Negative.   Psychiatric/Behavioral: Negative.        Today's Vitals   05/06/23 1423  BP: (!) 130/90  Pulse: 60  Temp: 98.2 F (36.8 C)  TempSrc: Oral  Weight: 237 lb (107.5 kg)  Height: 5\' 3"  (1.6 m)  PainSc: 0-No pain   Body mass index is 41.98 kg/m.  Wt Readings from Last 3 Encounters:  05/06/23 237 lb (107.5 kg)  11/20/14 182 lb (82.6 kg)    The 10-year ASCVD risk score (Arnett DK, et al., 2019) is: 7.3%   Values used to calculate the score:     Age: 65 years     Sex: Female     Is Non-Hispanic African American: Yes     Diabetic: No     Tobacco smoker: No     Systolic Blood Pressure: 130 mmHg     Is BP treated: Yes     HDL Cholesterol: 67 mg/dL     Total Cholesterol: 234 mg/dL  Objective:  Physical Exam Constitutional:      Appearance: Normal appearance.  HENT:     Head: Normocephalic.  Cardiovascular:     Rate and Rhythm: Normal rate and regular rhythm.     Pulses: Normal pulses.     Heart sounds: Normal heart sounds.  Pulmonary:     Effort: Pulmonary effort is normal.     Breath sounds: Normal breath sounds.  Abdominal:  General: Bowel sounds are normal.  Musculoskeletal:        General: Normal range of motion.  Skin:    General: Skin is warm and dry.  Neurological:     General: No focal deficit present.     Mental Status: She is alert and oriented to person, place, and time. Mental status is at baseline.  Psychiatric:        Mood and Affect: Mood normal.         Assessment And Plan:  Annual physical exam  Encounter to establish care with new doctor  Encounter for hepatitis C screening test for low risk patient -     Hepatitis C antibody  Screening for HIV (human immunodeficiency virus) -     HIV Antibody (routine testing w rflx)  Herpes zoster vaccination declined  Hypertension, unspecified type Assessment & Plan: BP Readings from Last 3 Encounters:  05/06/23 (!) 130/90  03/30/21 124/89  02/11/16 143/89    Chronic; fairly controlled this visit. Increase Amlodipine to 10 mg every  day   Orders: -     EKG 12-Lead -     POCT URINALYSIS DIP (CLINITEK) -     Microalbumin / creatinine urine ratio -     CBC -     CMP14+EGFR  Abnormal glucose Assessment & Plan: Low carb diet and exercise advised.  Orders: -     Hemoglobin A1c  Hyperlipidemia LDL goal <100 Assessment & Plan: Low fat and diet advised  Orders: -     Lipid panel  Class 3 severe obesity due to excess calories with serious comorbidity and body mass index (BMI) of 40.0 to 44.9 in adult [facility]) Assessment & Plan: She is encouraged to strive for BMI less than 30 to decrease cardiac risk. Advised to aim for at least 150 minutes of exercise per week.   Orders: -     Wegovy ; Inject 0.25 mg into the skin every 7 (seven) days.  Dispense: 2 mL; Refill: 1    Return in about 1 year (around 05/05/2024) for 2 months weight mgt; 1 year physical.  Patient was given opportunity to ask questions. Patient verbalized understanding of the plan and was able to repeat key elements of the plan. All questions were answered to their satisfaction.    I, Melodie Spry, NP, have reviewed all documentation for this visit. The documentation on 05/13/2023 for the exam, diagnosis, procedures, and orders are all accurate and complete.    IF YOU HAVE BEEN REFERRED TO A SPECIALIST, IT MAY TAKE 1-2 WEEKS TO SCHEDULE/PROCESS THE REFERRAL. IF YOU HAVE NOT HEARD FROM US /SPECIALIST IN TWO WEEKS, PLEASE GIVE US  A CALL AT [phone].

## 2023-05-07 ENCOUNTER — Telehealth: Payer: Self-pay

## 2023-05-07 LAB — CMP14+EGFR
ALT: 23 IU/L (ref 0–32)
AST: 25 IU/L (ref 0–40)
Albumin: 4.8 g/dL (ref 3.8–4.9)
Alkaline Phosphatase: 77 IU/L (ref 44–121)
BUN/Creatinine Ratio: 14 (ref 12–28)
BUN: 12 mg/dL (ref 8–27)
Bilirubin Total: 0.4 mg/dL (ref 0.0–1.2)
CO2: 25 mmol/L (ref 20–29)
Calcium: 10.1 mg/dL (ref 8.7–10.3)
Chloride: 99 mmol/L (ref 96–106)
Creatinine, Ser: 0.87 mg/dL (ref 0.57–1.00)
Globulin, Total: 2.6 g/dL (ref 1.5–4.5)
Glucose: 96 mg/dL (ref 70–99)
Potassium: 3.9 mmol/L (ref 3.5–5.2)
Sodium: 139 mmol/L (ref 134–144)
Total Protein: 7.4 g/dL (ref 6.0–8.5)
eGFR: 76 mL/min/{1.73_m2} (ref >59)

## 2023-05-07 LAB — HEMOGLOBIN A1C
Est. average glucose Bld gHb Est-mCnc: 126 mg/dL
Hgb A1c MFr Bld: 6 % — ABNORMAL HIGH (ref 4.8–5.6)

## 2023-05-07 LAB — HIV ANTIBODY (ROUTINE TESTING W REFLEX): HIV Screen 4th Generation wRfx: NONREACTIVE

## 2023-05-07 LAB — MICROALBUMIN / CREATININE URINE RATIO
Creatinine, Urine: 139.5 mg/dL
Microalb/Creat Ratio: 3 mg/g{creat} (ref 0–29)
Microalbumin, Urine: 3.6 ug/mL

## 2023-05-07 LAB — LIPID PANEL
Chol/HDL Ratio: 3.5 ratio (ref 0.0–4.4)
Cholesterol, Total: 234 mg/dL — ABNORMAL HIGH (ref 100–199)
HDL: 67 mg/dL (ref >39)
LDL Chol Calc (NIH): 153 mg/dL — ABNORMAL HIGH (ref 0–99)
Triglycerides: 80 mg/dL (ref 0–149)
VLDL Cholesterol Cal: 14 mg/dL (ref 5–40)

## 2023-05-07 LAB — CBC
Hematocrit: 41.9 % (ref 34.0–46.6)
Hemoglobin: 13.6 g/dL (ref 11.1–15.9)
MCH: 27.5 pg (ref 26.6–33.0)
MCHC: 32.5 g/dL (ref 31.5–35.7)
MCV: 85 fL (ref 79–97)
Platelets: 335 10*3/uL (ref 150–450)
RBC: 4.95 x10E6/uL (ref 3.77–5.28)
RDW: 12.7 % (ref 11.7–15.4)
WBC: 6 10*3/uL (ref 3.4–10.8)

## 2023-05-07 LAB — HEPATITIS C ANTIBODY: Hep C Virus Ab: NONREACTIVE

## 2023-05-07 NOTE — Telephone Encounter (Signed)
 error

## 2023-05-08 ENCOUNTER — Encounter: Payer: Self-pay | Admitting: Family Medicine

## 2023-05-13 ENCOUNTER — Telehealth: Payer: Self-pay

## 2023-05-13 ENCOUNTER — Encounter: Payer: Self-pay | Admitting: Family Medicine

## 2023-05-13 DIAGNOSIS — Z0000 Encounter for general adult medical examination without abnormal findings: Secondary | ICD-10-CM | POA: Insufficient documentation

## 2023-05-13 DIAGNOSIS — Z1159 Encounter for screening for other viral diseases: Secondary | ICD-10-CM | POA: Insufficient documentation

## 2023-05-13 DIAGNOSIS — Z2821 Immunization not carried out because of patient refusal: Secondary | ICD-10-CM | POA: Insufficient documentation

## 2023-05-13 DIAGNOSIS — E785 Hyperlipidemia, unspecified: Secondary | ICD-10-CM | POA: Insufficient documentation

## 2023-05-13 DIAGNOSIS — Z114 Encounter for screening for human immunodeficiency virus [HIV]: Secondary | ICD-10-CM | POA: Insufficient documentation

## 2023-05-13 DIAGNOSIS — Z1231 Encounter for screening mammogram for malignant neoplasm of breast: Secondary | ICD-10-CM | POA: Insufficient documentation

## 2023-05-13 DIAGNOSIS — Z7689 Persons encountering health services in other specified circumstances: Secondary | ICD-10-CM | POA: Insufficient documentation

## 2023-05-13 DIAGNOSIS — R7309 Other abnormal glucose: Secondary | ICD-10-CM | POA: Insufficient documentation

## 2023-05-13 MED ORDER — ATORVASTATIN CALCIUM 20 MG PO TABS: 20.0000 mg | ORAL_TABLET | Freq: Every day | ORAL | 11 refills | Status: DC

## 2023-05-13 NOTE — Assessment & Plan Note (Signed)
 Low fat and diet advised

## 2023-05-13 NOTE — Assessment & Plan Note (Signed)
 BP Readings from Last 3 Encounters:  05/06/23 (!) 130/90  03/30/21 124/89  02/11/16 143/89    Chronic; fairly controlled this visit. Increase Amlodipine to 10 mg every day

## 2023-05-13 NOTE — Assessment & Plan Note (Signed)
 She is encouraged to strive for BMI less than 30 to decrease cardiac risk. Advised to aim for at least 150 minutes of exercise per week.

## 2023-05-13 NOTE — Assessment & Plan Note (Signed)
 Low carb diet and exercise advised

## 2023-05-13 NOTE — Telephone Encounter (Signed)
 I called patient to notify her that I spoke with her insurance company to do the prior auth for wegovy  and it is a plan exclusion. I notified patient and she stated "she already knew that." [facility]

## 2023-05-13 NOTE — Progress Notes (Signed)
 A1c is 6.0 which is prediabetic/ low carb diet and exercise advise. Total cholesterol and bad cholesterool are both elevated at 234 and 153 respectively. Atorvastatin 20 mg once daily have been sent to the pharmacy.

## 2023-07-15 ENCOUNTER — Ambulatory Visit: Admitting: Family Medicine

## 2023-08-13 ENCOUNTER — Other Ambulatory Visit: Payer: Self-pay | Admitting: Family Medicine

## 2023-08-13 MED ORDER — AMLODIPINE BESYLATE 10 MG PO TABS: 10.0000 mg | ORAL_TABLET | Freq: Every day | ORAL | 1 refills | Status: DC

## 2023-08-13 NOTE — Telephone Encounter (Signed)
 Copied from CRM [id number]. Topic: Clinical - Medication Refill >> Aug 13, 2023  3:07 PM Edsel HERO wrote: Medication: amLODipine  (NORVASC ) 10 MG tablet  Has the patient contacted their pharmacy? No  This is the patient's preferred pharmacy:  [facility] [id number] [place] - [address] [place] [address] Phone: [phone] Fax: [phone]  Is this the correct pharmacy for this prescription? Yes If no, delete pharmacy and type the correct one.   Has the prescription been filled recently? Yes  Is the patient out of the medication? Yes  Has the patient been seen for an appointment in the last year OR does the patient have an upcoming appointment? Yes  Can we respond through MyChart? Yes

## 2023-08-26 ENCOUNTER — Ambulatory Visit: Admitting: Family Medicine

## 2023-08-26 ENCOUNTER — Other Ambulatory Visit: Payer: Self-pay | Admitting: Family Medicine

## 2023-08-26 NOTE — Telephone Encounter (Signed)
 Copied from CRM [id number]. Topic: Clinical - Medication Refill >> Aug 26, 2023 11:27 AM Marissa P wrote: Medication: losartan -hydrochlorothiazide (HYZAAR) 100-25 MG tablet  Has the patient contacted their pharmacy? No (Agent: If no, request that the patient contact the pharmacy for the refill. If patient does not wish to contact the pharmacy document the reason why and proceed with request.) (Agent: If yes, when and what did the pharmacy advise?)  This is the patient's preferred pharmacy:  [id number] [address], [place] - [address] [place] [address] Phone: [phone] Fax: [phone]  Is this the correct pharmacy for this prescription? Yes If no, delete pharmacy and type the correct one.   Has the prescription been filled recently? Yes  Is the patient out of the medication? No  Has the patient been seen for an appointment in the last year OR does the patient have an upcoming appointment? Yes  Can we respond through MyChart? Yes  Agent: Please be advised that Rx refills may take up to 3 business days. We ask that you follow-up with your pharmacy.

## 2023-08-27 MED ORDER — LOSARTAN POTASSIUM-HCTZ 100-25 MG PO TABS: 1.0000 | ORAL_TABLET | Freq: Every day | ORAL | 1 refills | Status: DC

## 2023-09-02 ENCOUNTER — Encounter: Payer: Self-pay | Admitting: Family Medicine

## 2023-09-09 ENCOUNTER — Ambulatory Visit: Payer: Self-pay | Admitting: Family Medicine

## 2023-09-30 ENCOUNTER — Ambulatory Visit: Payer: Self-pay | Admitting: Family Medicine

## 2023-10-14 ENCOUNTER — Other Ambulatory Visit: Payer: Self-pay | Admitting: Family Medicine

## 2023-10-21 ENCOUNTER — Other Ambulatory Visit: Payer: Self-pay | Admitting: Family Medicine

## 2023-10-21 ENCOUNTER — Encounter: Payer: Self-pay | Admitting: Family Medicine

## 2023-10-21 ENCOUNTER — Ambulatory Visit: Payer: Self-pay | Admitting: Family Medicine

## 2023-10-21 VITALS — BP 124/80 | HR 92 | Temp 98.2°F | Ht 63.0 in | Wt 232.0 lb

## 2023-10-21 DIAGNOSIS — E785 Hyperlipidemia, unspecified: Secondary | ICD-10-CM | POA: Diagnosis not present

## 2023-10-21 DIAGNOSIS — R7309 Other abnormal glucose: Secondary | ICD-10-CM | POA: Diagnosis not present

## 2023-10-21 DIAGNOSIS — R202 Paresthesia of skin: Secondary | ICD-10-CM | POA: Insufficient documentation

## 2023-10-21 DIAGNOSIS — Z85038 Personal history of other malignant neoplasm of large intestine: Secondary | ICD-10-CM

## 2023-10-21 DIAGNOSIS — I10 Essential (primary) hypertension: Secondary | ICD-10-CM

## 2023-10-21 DIAGNOSIS — Z2821 Immunization not carried out because of patient refusal: Secondary | ICD-10-CM

## 2023-10-21 DIAGNOSIS — E66813 Obesity, class 3: Secondary | ICD-10-CM

## 2023-10-21 DIAGNOSIS — Z6841 Body Mass Index (BMI) 40.0 and over, adult: Secondary | ICD-10-CM

## 2023-10-21 MED ORDER — ATORVASTATIN CALCIUM 20 MG PO TABS: 20.0000 mg | ORAL_TABLET | Freq: Every day | ORAL | 1 refills | Status: AC

## 2023-10-21 MED ORDER — LOSARTAN POTASSIUM-HCTZ 100-25 MG PO TABS: 1.0000 | ORAL_TABLET | Freq: Every day | ORAL | 1 refills | Status: AC

## 2023-10-21 MED ORDER — AMLODIPINE BESYLATE 10 MG PO TABS: 10.0000 mg | ORAL_TABLET | Freq: Every day | ORAL | 1 refills | Status: AC

## 2023-10-21 NOTE — Progress Notes (Signed)
 Monique Robinson, CMA,acting as a Neurosurgeon for [employer], NP.,have documented all relevant documentation on the behalf of Monique Creighton, NP,as directed by  Monique Creighton, NP while in the presence of Monique Creighton, NP.  Subjective:  Patient ID: Monique Robinson , female    DOB: 08-Feb-1960 , 63 y.o.   MRN: [id number]  Chief Complaint  Patient presents with   Hypertension    Patient presents today for a bpc. Patient reports compliance with her meds. Patient denies having chest pain,sob or headaches at this time. Patient also complains of tingling in her feet. She reports the tingling started about a month or  2 ago.     HPI Discussed the use of AI scribe software for clinical note transcription with the patient, who gave verbal consent to proceed.  History of Present Illness    Monique Robinson is a 62 year old female with hypertension and prediabetes who presents for chronic disease management.  She has been experiencing tingling in her feet for about a month, with the left foot being more affected than the right. The tingling sometimes disturbs her sleep, particularly at night. No pain is associated with the tingling.  She is currently taking amlodipine , losartan , and atorvastatin  for hypertension and hyperlipidemia.  She has a history of colon cancer diagnosed in 2006, for which she underwent surgery and chemotherapy. Part of her colon was removed, and she has been undergoing regular colonoscopies every three years since then.  She works a Artist schedule as a Solicitor, which affects her eating habits and physical activity. She reports eating only once a day due to her work schedule and lack of movement during her shifts. She does not smoke and is not exposed to secondhand smoke.  She is postmenopausal and does not take a multivitamin or calcium  supplements. She has a history of back problems noted in her medical records, although she does not recall having significant back issues. No  current back pain.  BMI is 41. She is encouraged to strive for BMI less than 30 to decrease cardiac risk. Advised to aim for at least 150 minutes of exercise per week.       Past Medical History:  Diagnosis Date   Cancer [facility])    Colon cancer (HCC) 2006   Hypertension      Family History  Problem Relation Age of Onset   Hypertension Mother    Breast cancer Maternal Grandmother      Current Outpatient Medications:    aspirin EC 81 MG tablet, Take 81 mg by mouth daily. Swallow whole., Disp: , Rfl:    amLODipine  (NORVASC ) 10 MG tablet, Take 1 tablet (10 mg total) by mouth daily., Disp: 90 tablet, Rfl: 1   atorvastatin  (LIPITOR) 20 MG tablet, Take 1 tablet (20 mg total) by mouth daily., Disp: 90 tablet, Rfl: 1   losartan -hydrochlorothiazide (HYZAAR) 100-25 MG tablet, Take 1 tablet by mouth daily., Disp: 90 tablet, Rfl: 1   Allergies  Allergen Reactions   Olmesartan-Amlodipine -Hctz      Review of Systems  Constitutional: Negative.   HENT: Negative.    Respiratory: Negative.    Cardiovascular:  Negative for chest pain, palpitations and leg swelling.  Gastrointestinal: Negative.      Today's Vitals   10/21/23 1051  BP: 124/80  Pulse: 92  Temp: 98.2 F (36.8 C)  TempSrc: Oral  Weight: 232 lb (105.2 kg)  Height: 5' 3 (1.6 m)  PainSc: 0-No pain   Body mass index  is 41.1 kg/m.  Wt Readings from Last 3 Encounters:  10/21/23 232 lb (105.2 kg)  05/06/23 237 lb (107.5 kg)  11/20/14 182 lb (82.6 kg)    The 10-year ASCVD risk score (Arnett DK, et al., 2019) is: 3.2%   Values used to calculate the score:     Age: 27 years     Clincally relevant sex: Female     Is Non-Hispanic African American: Yes     Diabetic: No     Tobacco smoker: No     Systolic Blood Pressure: 103 mmHg     Is BP treated: Yes     HDL Cholesterol: 53 mg/dL     Total Cholesterol: 155 mg/dL  Objective:  Physical Exam Constitutional:      Appearance: Normal appearance.  Cardiovascular:      Rate and Rhythm: Normal rate and regular rhythm.     Pulses: Normal pulses.     Heart sounds: Normal heart sounds.  Pulmonary:     Effort: Pulmonary effort is normal.     Breath sounds: Normal breath sounds.  Abdominal:     General: Bowel sounds are normal.  Neurological:     Mental Status: She is alert.         Assessment And Plan:  Tingling of both feet Assessment & Plan: Differential includes low vitamin B12 or diabetes-related neuropathy. - Ordered vitamin B12 level. - Ordered A1c to assess for diabetes-related neuropathy.  Orders: -     Vitamin B12  Abnormal glucose Assessment & Plan: Check a1c  Orders: -     Hemoglobin A1c  Hyperlipidemia LDL goal <100 Assessment & Plan: - Refilled  atorvastatin  for 90 days.  Orders: -     Lipid panel -     Atorvastatin  Calcium ; Take 1 tablet (20 mg total) by mouth daily.  Dispense: 90 tablet; Refill: 1  Influenza vaccination declined  Primary hypertension Assessment & Plan: Chronic. Stable. - Refilled amlodipine , losartan ,  for 90 days.  Orders: -     CBC -     CMP14+EGFR -     Losartan  Potassium-HCTZ; Take 1 tablet by mouth daily.  Dispense: 90 tablet; Refill: 1 -     amLODIPine  Besylate; Take 1 tablet (10 mg total) by mouth daily.  Dispense: 90 tablet; Refill: 1  History of colon cancer, stage II Assessment & Plan: Diagnosed by Dr Rollin 2006, had surgery 2007, rounds of chemo.  Colonoscopy now every 3 years   Class 3 severe obesity due to excess calories with serious comorbidity and body mass index (BMI) of 40.0 to 44.9 in adult [facility]) Assessment & Plan: She is encouraged to strive for BMI less than 30 to decrease cardiac risk. Advised to aim for at least 150 minutes of exercise per week.   Orders: -     Amb Ref to Medical Weight Management    Assessment & Plan Essential hypertension, hyperlipidemia, and abnormal glucose metabolism Managing hypertension, hyperlipidemia, and abnormal glucose metabolism.  Concern for prediabetes. - Refilled amlodipine , losartan , and atorvastatin  for 90 days. - Ordered A1c to assess blood sugar levels.  Peripheral neuropathy, bilateral feet Reports tingling in both feet, more pronounced in the left. Differential includes low vitamin B12 or diabetes-related neuropathy. - Ordered vitamin B12 level. - Ordered A1c to assess for diabetes-related neuropathy.  Obesity, class 3 Class 3 obesity. Discussed challenges of obtaining Wegovy  due to insurance issues.  Colon cancer, status post resection and chemotherapy Stage 2 colon cancer treated with resection and chemotherapy.  On surveillance schedule with colonoscopies every three years, next due in 2026.  Postmenopausal state Postmenopausal and does not take a multivitamin. Discussed importance of calcium  and vitamin D for bone health. - Recommended starting a multivitamin with calcium  and vitamin D.   Return for keep next appt.  Patient was given opportunity to ask questions. Patient verbalized understanding of the plan and was able to repeat key elements of the plan. All questions were answered to their satisfaction.    I, Monique Creighton, NP, have reviewed all documentation for this visit. The documentation on 10/30/2023 for the exam, diagnosis, procedures, and orders are all accurate and complete.    IF YOU HAVE BEEN REFERRED TO A SPECIALIST, IT MAY TAKE 1-2 WEEKS TO SCHEDULE/PROCESS THE REFERRAL. IF YOU HAVE NOT HEARD FROM US /SPECIALIST IN TWO WEEKS, PLEASE GIVE US  A CALL AT [phone].

## 2023-10-21 NOTE — Assessment & Plan Note (Signed)
 Diagnosed by Dr Rollin 2006, had surgery 2007, rounds of chemo.  Colonoscopy now every 3 years

## 2023-10-22 LAB — CMP14+EGFR
ALT: 25 IU/L (ref 0–32)
AST: 27 IU/L (ref 0–40)
Albumin: 4.6 g/dL (ref 3.9–4.9)
Alkaline Phosphatase: 79 IU/L (ref 49–135)
BUN/Creatinine Ratio: 19 (ref 12–28)
BUN: 20 mg/dL (ref 8–27)
Bilirubin Total: 0.4 mg/dL (ref 0.0–1.2)
CO2: 22 mmol/L (ref 20–29)
Calcium: 9.8 mg/dL (ref 8.7–10.3)
Chloride: 102 mmol/L (ref 96–106)
Creatinine, Ser: 1.06 mg/dL — ABNORMAL HIGH (ref 0.57–1.00)
Globulin, Total: 2.8 g/dL (ref 1.5–4.5)
Glucose: 97 mg/dL (ref 70–99)
Potassium: 3.4 mmol/L — ABNORMAL LOW (ref 3.5–5.2)
Sodium: 141 mmol/L (ref 134–144)
Total Protein: 7.4 g/dL (ref 6.0–8.5)
eGFR: 60 mL/min/1.73 (ref >59)

## 2023-10-22 LAB — CBC
Hematocrit: 40 % (ref 34.0–46.6)
Hemoglobin: 13.1 g/dL (ref 11.1–15.9)
MCH: 28.2 pg (ref 26.6–33.0)
MCHC: 32.8 g/dL (ref 31.5–35.7)
MCV: 86 fL (ref 79–97)
Platelets: 322 x10E3/uL (ref 150–450)
RBC: 4.65 x10E6/uL (ref 3.77–5.28)
RDW: 12.4 % (ref 11.7–15.4)
WBC: 8.5 x10E3/uL (ref 3.4–10.8)

## 2023-10-22 LAB — LIPID PANEL
Chol/HDL Ratio: 2.9 ratio (ref 0.0–4.4)
Cholesterol, Total: 155 mg/dL (ref 100–199)
HDL: 53 mg/dL (ref >39)
LDL Chol Calc (NIH): 89 mg/dL (ref 0–99)
Triglycerides: 63 mg/dL (ref 0–149)
VLDL Cholesterol Cal: 13 mg/dL (ref 5–40)

## 2023-10-22 LAB — HEMOGLOBIN A1C
Est. average glucose Bld gHb Est-mCnc: 128 mg/dL
Hgb A1c MFr Bld: 6.1 % — ABNORMAL HIGH (ref 4.8–5.6)

## 2023-10-22 LAB — VITAMIN B12: Vitamin B-12: 1077 pg/mL (ref 232–1245)

## 2023-10-27 ENCOUNTER — Ambulatory Visit (INDEPENDENT_AMBULATORY_CARE_PROVIDER_SITE_OTHER)

## 2023-10-27 VITALS — BP 103/81 | HR 85 | Resp 16

## 2023-10-27 DIAGNOSIS — Z23 Encounter for immunization: Secondary | ICD-10-CM

## 2023-10-27 NOTE — Progress Notes (Signed)
 Fluzone administered left deltoid -pt tolerated injection well

## 2023-10-30 ENCOUNTER — Ambulatory Visit: Payer: Self-pay | Admitting: Family Medicine

## 2023-10-30 MED ORDER — POTASSIUM CHLORIDE ER 10 MEQ PO CPCR: 10.0000 meq | ORAL_CAPSULE | Freq: Every day | ORAL | 0 refills | Status: AC

## 2023-10-30 NOTE — Progress Notes (Signed)
 Your kidney function has dropped with creatinine increasing to 1.06, Potassium dropped to 3.4, will put you on some potassium pills.   Your cholesterol levels have dropped and improved. Keep up the good work of diet, exercise and taking your medications. Your B12 is normal , your A1c went up to 6.1.continue low carbs, low concentrated sweets diet.  If the tingling continues we can try gabapentin as needed.   Thank you!

## 2023-10-30 NOTE — Assessment & Plan Note (Signed)
 Chronic. Stable. - Refilled amlodipine , losartan ,  for 90 days.

## 2023-10-30 NOTE — Assessment & Plan Note (Signed)
 She is encouraged to strive for BMI less than 30 to decrease cardiac risk. Advised to aim for at least 150 minutes of exercise per week.

## 2023-10-30 NOTE — Assessment & Plan Note (Signed)
 Check a1c

## 2023-10-30 NOTE — Assessment & Plan Note (Signed)
 Differential includes low vitamin B12 or diabetes-related neuropathy. - Ordered vitamin B12 level. - Ordered A1c to assess for diabetes-related neuropathy.

## 2023-10-30 NOTE — Assessment & Plan Note (Signed)
Refilled atorvastatin for 90 days.

## 2023-11-05 ENCOUNTER — Ambulatory Visit: Admitting: Dietician

## 2024-05-11 ENCOUNTER — Encounter: Payer: Self-pay | Admitting: Family Medicine
